# Patient Record
Sex: Female | Born: 1990 | Race: White | Hispanic: No | Marital: Married | Smoking: Former smoker
Health system: Southern US, Community
[De-identification: ages and names within clinical notes are randomized; demographics above are authoritative.]

## PROBLEM LIST (undated history)

## (undated) ENCOUNTER — Ambulatory Visit (HOSPITAL_COMMUNITY): Admission: EM | Source: Home / Self Care

## (undated) ENCOUNTER — Inpatient Hospital Stay (HOSPITAL_COMMUNITY): Payer: Self-pay

## (undated) DIAGNOSIS — I959 Hypotension, unspecified: Secondary | ICD-10-CM

## (undated) DIAGNOSIS — E119 Type 2 diabetes mellitus without complications: Secondary | ICD-10-CM

---

## 2018-12-10 ENCOUNTER — Other Ambulatory Visit: Payer: Self-pay

## 2018-12-10 ENCOUNTER — Emergency Department (HOSPITAL_COMMUNITY)
Admission: EM | Admit: 2018-12-10 | Discharge: 2018-12-10 | Disposition: A | Discharge status: Home / Self Care | Payer: Medicaid Other | Attending: Emergency Medicine | Admitting: Emergency Medicine

## 2018-12-10 ENCOUNTER — Emergency Department (HOSPITAL_COMMUNITY): Payer: Medicaid Other

## 2018-12-10 ENCOUNTER — Encounter (HOSPITAL_COMMUNITY): Payer: Self-pay | Admitting: Emergency Medicine

## 2018-12-10 DIAGNOSIS — F17200 Nicotine dependence, unspecified, uncomplicated: Secondary | ICD-10-CM | POA: Diagnosis not present

## 2018-12-10 DIAGNOSIS — Y939 Activity, unspecified: Secondary | ICD-10-CM | POA: Insufficient documentation

## 2018-12-10 DIAGNOSIS — W010XXA Fall on same level from slipping, tripping and stumbling without subsequent striking against object, initial encounter: Secondary | ICD-10-CM | POA: Insufficient documentation

## 2018-12-10 DIAGNOSIS — Y929 Unspecified place or not applicable: Secondary | ICD-10-CM | POA: Insufficient documentation

## 2018-12-10 DIAGNOSIS — Y999 Unspecified external cause status: Secondary | ICD-10-CM | POA: Diagnosis not present

## 2018-12-10 DIAGNOSIS — S60416A Abrasion of right little finger, initial encounter: Secondary | ICD-10-CM | POA: Diagnosis not present

## 2018-12-10 DIAGNOSIS — S6991XA Unspecified injury of right wrist, hand and finger(s), initial encounter: Secondary | ICD-10-CM

## 2018-12-10 DIAGNOSIS — S6992XA Unspecified injury of left wrist, hand and finger(s), initial encounter: Secondary | ICD-10-CM | POA: Diagnosis present

## 2018-12-10 NOTE — ED Triage Notes (Signed)
Patient fell and injured her right 5th finger when her dog pushed her this afternoon , no LOC/ambulatory , presents with mild redness at 5th right knuckle and superficial abrasion .

## 2018-12-10 NOTE — Discharge Instructions (Signed)
Can take tylenol or motrin for pain.  Up to 800mg  motrin 3x daily and up to 2000mg  tylenol daily is fine. Can follow-up with hand specialist if you have any ongoing issues-- call office for appt. Return here for any acute changes.

## 2018-12-10 NOTE — ED Provider Notes (Signed)
[facility] EMERGENCY DEPARTMENT Provider Note   CSN: [id number] Arrival date & time: 12/10/18  1800     History   Chief Complaint Chief Complaint  Patient presents with  . Hand Injury    HPI Robin Petersen is a 28 y.o. female.     The history is provided by the patient and a friend.     28 year old female presenting to the ED with right fifth finger pain.  States she was pushed down by dog this afternoon and fell onto right hand held into a fist.  States she has a lot of pain along her right little finger.  She is able to flex and extend but not fully.  She denies any numbness or tingling.  She is right-hand dominant.  History reviewed. No pertinent past medical history.  There are no active problems to display for this patient.   History reviewed. No pertinent surgical history.   OB History   No obstetric history on file.      Home Medications    Prior to Admission medications   Not on File    Family History No family history on file.  Social History Social History   Tobacco Use  . Smoking status: Current Every Day Smoker  . Smokeless tobacco: Never Used  Substance Use Topics  . Alcohol use: Never    Frequency: Never  . Drug use: Yes    Types: Marijuana     Allergies   Patient has no known allergies.   Review of Systems Review of Systems  Musculoskeletal: Positive for arthralgias.  All other systems reviewed and are negative.    Physical Exam Updated Vital Signs BP 114/63   Pulse 85   Temp 98.5 F (36.9 C) (Oral)   Resp 16   LMP 12/03/2018 (Approximate)   SpO2 100%   Physical Exam Vitals signs and nursing note reviewed.  Constitutional:      Appearance: She is well-developed.  HENT:     Head: Normocephalic and atraumatic.  Eyes:     Conjunctiva/sclera: Conjunctivae normal.     Pupils: Pupils are equal, round, and reactive to light.  Neck:     Musculoskeletal: Normal range of motion.  Cardiovascular:      Rate and Rhythm: Normal rate and regular rhythm.     Heart sounds: Normal heart sounds.  Pulmonary:     Effort: Pulmonary effort is normal.     Breath sounds: Normal breath sounds.  Abdominal:     General: Bowel sounds are normal.     Palpations: Abdomen is soft.  Musculoskeletal: Normal range of motion.     Comments: Right hand grossly normal in appearance, abrasion noted over fifth MCP joint, able to flex and extend finger without issue, slightly limited and unable to fully make a first, normal distal sensation and cap refill  Skin:    General: Skin is warm and dry.  Neurological:     Mental Status: She is alert and oriented to person, place, and time.      ED Treatments / Results  Labs (all labs ordered are listed, but only abnormal results are displayed) Labs Reviewed - No data to display  EKG None  Radiology Dg Hand Complete Right  Result Date: 12/10/2018 CLINICAL DATA:  28 year old who fell and injured the RIGHT small finger when her dog tripped her earlier today. Initial encounter. EXAM: RIGHT HAND - COMPLETE 3+ VIEW COMPARISON:  None. FINDINGS: Slight DORSAL subluxation of the fifth MTP joint  as seen on the LATERAL image. No evidence of acute fracture. Well-preserved joint spaces. Well-preserved bone mineral density. IMPRESSION: Slight DORSAL subluxation of the fifth MTP joint.  No fractures. Electronically Signed   By: Hulan Saas M.D.   On: 12/10/2018 19:52    Procedures Procedures (including critical care time)  Medications Ordered in ED Medications - No data to display   Initial Impression / Assessment and Plan / ED Course  I have reviewed the triage vital signs and the nursing notes.  Pertinent labs & imaging results that were available during my care of the patient were reviewed by me and considered in my medical decision making (see chart for details).  28 year old female here with right fifth finger injury.  She was pushed down by her dog today and  fell onto hand balled into a fist.  She reports pain throughout 5th digit.  She has no gross deformity on exam, does have small abrasion over the fifth MCP joint.  She is able to flex and extend finger, slightly limited from baseline.  X-ray with slight dorsal subluxation of the fifth MCP joint.  I question if this is simply positioning as this is not evident on exam.  She was placed into a static splint.  She will be given hand surgery follow-up if any ongoing issues.  She may return here for any new or acute changes.  Final Clinical Impressions(s) / ED Diagnoses   Final diagnoses:  Finger injury, right, initial encounter    ED Discharge Orders    None       Garlon Hatchet, PA-C 12/10/18 2259    Gerhard Munch, MD 12/11/18 [phone]

## 2018-12-10 NOTE — ED Notes (Signed)
Patient verbalizes understanding of discharge instructions. Opportunity for questioning and answers were provided. Armband removed by staff, pt discharged from ED ambulatory to home.  

## 2018-12-15 ENCOUNTER — Encounter (HOSPITAL_COMMUNITY): Payer: Self-pay | Admitting: Emergency Medicine

## 2018-12-15 ENCOUNTER — Emergency Department (HOSPITAL_COMMUNITY): Payer: Self-pay

## 2018-12-15 ENCOUNTER — Other Ambulatory Visit: Payer: Self-pay

## 2018-12-15 ENCOUNTER — Emergency Department (HOSPITAL_COMMUNITY)
Admission: EM | Admit: 2018-12-15 | Discharge: 2018-12-15 | Disposition: A | Discharge status: Home / Self Care | Payer: Self-pay | Attending: Emergency Medicine | Admitting: Emergency Medicine

## 2018-12-15 DIAGNOSIS — F17210 Nicotine dependence, cigarettes, uncomplicated: Secondary | ICD-10-CM | POA: Insufficient documentation

## 2018-12-15 DIAGNOSIS — S9001XA Contusion of right ankle, initial encounter: Secondary | ICD-10-CM | POA: Insufficient documentation

## 2018-12-15 DIAGNOSIS — Y9301 Activity, walking, marching and hiking: Secondary | ICD-10-CM | POA: Insufficient documentation

## 2018-12-15 DIAGNOSIS — X500XXA Overexertion from strenuous movement or load, initial encounter: Secondary | ICD-10-CM | POA: Insufficient documentation

## 2018-12-15 DIAGNOSIS — Y999 Unspecified external cause status: Secondary | ICD-10-CM | POA: Insufficient documentation

## 2018-12-15 DIAGNOSIS — Y929 Unspecified place or not applicable: Secondary | ICD-10-CM | POA: Insufficient documentation

## 2018-12-15 DIAGNOSIS — S99911A Unspecified injury of right ankle, initial encounter: Secondary | ICD-10-CM

## 2018-12-15 MED ORDER — NAPROXEN 500 MG PO TABS: 500.0000 mg | ORAL_TABLET | Freq: Two times a day (BID) | ORAL | 0 refills | Status: DC

## 2018-12-15 MED ORDER — NAPROXEN 250 MG PO TABS
500.0000 mg | ORAL_TABLET | Freq: Once | ORAL | Status: AC
  Administered 2018-12-15: 14:00:00 500 mg via ORAL
  Filled 2018-12-15: qty 2

## 2018-12-15 NOTE — Progress Notes (Signed)
Orthopedic Tech Progress Note Patient Details:  Robin Petersen [birth year] [id number]  Ortho Devices Type of Ortho Device: ASO, Crutches Ortho Device/Splint Location: right Ortho Device/Splint Interventions: Application   Post Interventions Patient Tolerated: Well Instructions Provided: Care of device   [name] 12/15/2018, 1:47 PM

## 2018-12-15 NOTE — Discharge Instructions (Signed)
Please read and follow all provided instructions.  You have been seen today for a right ankle injury.   Tests performed today include: An x-ray of the affected area - does NOT show any broken bones or dislocations.  Vital signs. See below for your results today.   Home care instructions: -- *PRICE in the first 24-48 hours after injury: Protect (with brace, splint, sling), if given by your provider Rest Ice- Do not apply ice pack directly to your skin, place towel or similar between your skin and ice/ice pack. Apply ice for 20 min, then remove for 40 min while awake Compression- Wear brace, elastic bandage, splint as directed by your provider Elevate affected extremity above the level of your heart when not walking around for the first 24-48 hours   Medications:  - Naproxen is a nonsteroidal anti-inflammatory medication that will help with pain and swelling. Be sure to take this medication as prescribed with food, 1 pill every 12 hours,  It should be taken with food, as it can cause stomach upset, and more seriously, stomach bleeding. Do not take other nonsteroidal anti-inflammatory medications with this such as Advil, Motrin, Aleve, Mobic, Goodie Powder, or Motrin.   You make take Tylenol per over the counter dosing with these medications.   We have prescribed you new medication(s) today. Discuss the medications prescribed today with your pharmacist as they can have adverse effects and interactions with your other medicines including over the counter and prescribed medications. Seek medical evaluation if you start to experience new or abnormal symptoms after taking one of these medicines, seek care immediately if you start to experience difficulty breathing, feeling of your throat closing, facial swelling, or rash as these could be indications of a more serious allergic reaction   Follow-up instructions: Please follow-up with your primary care provider or the provided orthopedic physician  (bone specialist) if you continue to have significant pain in 1 week. In this case you may have a more severe injury that requires further care.   Return instructions:  Please return if your digits or extremity are numb or tingling, appear gray or blue, or you have severe pain (also elevate the extremity and loosen splint or wrap if you were given one) Please return if you have redness or fevers.  Please return to the Emergency Department if you experience worsening symptoms.  Please return if you have any other emergent concerns. Additional Information:  Your vital signs today were: BP 98/74    Pulse 89    Temp 98.5 F (36.9 C) (Oral)    Resp 16    Ht 5\' 7"  (1.702 m)    Wt 113.4 kg    LMP 12/03/2018 (Approximate)    SpO2 100%    BMI 39.16 kg/m  If your blood pressure (BP) was elevated above 135/85 this visit, please have this repeated by your doctor within one month. ---------------

## 2018-12-15 NOTE — ED Provider Notes (Signed)
[facility]   CSN: [id number] Arrival date & time: 12/15/18  1035     History   Chief Complaint Chief Complaint  Patient presents with  . Ankle Pain    HPI Robin Petersen is a 28 y.o. female with a hx of tobacco abuse who presents to the ED w/ complaints of R ankle pain s/p injury yesterday. Patient states she was walking, tripped, inverted the ankle & fell to the ground. Denies head injury or LOC. States she has pain only to the R ankle area which is constant, 10/10 in severity, worse with weightbearing/movement, no alleviating factors, no intervention PTA. Reports associated swelling. Denies fever, chills, numbness, weakness, or open wounds. LMP 2 weeks ago, not currently sexually active, denies chance of pregnancy.     HPI  History reviewed. No pertinent past medical history.  There are no active problems to display for this patient.   History reviewed. No pertinent surgical history.   OB History   No obstetric history on file.      Home Medications    Prior to Admission medications   Not on File    Family History No family history on file.  Social History Social History   Tobacco Use  . Smoking status: Current Every Day Smoker  . Smokeless tobacco: Never Used  Substance Use Topics  . Alcohol use: Never    Frequency: Never  . Drug use: Yes    Types: Marijuana     Allergies   Patient has no known allergies.   Review of Systems Review of Systems  Constitutional: Negative for chills and fever.  Respiratory: Negative for shortness of breath.   Cardiovascular: Negative for chest pain.  Gastrointestinal: Negative for abdominal pain.  Musculoskeletal: Positive for arthralgias and joint swelling. Negative for back pain and neck pain.  Skin: Negative for wound.  Neurological: Negative for weakness and numbness.     Physical Exam Updated Vital Signs BP (!) 89/58 (BP Location: Right Arm)    Pulse 94   Temp 98.5 F (36.9 C) (Oral)   Resp 16   Ht 5\' 7"  (1.702 m)   Wt 113.4 kg   LMP 12/03/2018 (Approximate)   SpO2 100%   BMI 39.16 kg/m   Physical Exam Vitals signs and nursing note reviewed.  Constitutional:      General: She is not in acute distress.    Appearance: She is not ill-appearing or toxic-appearing.  HENT:     Head: Normocephalic and atraumatic.  Neck:     Musculoskeletal: Normal range of motion and neck supple.     Comments: No midline tenderness.  Cardiovascular:     Rate and Rhythm: Normal rate and regular rhythm.     Pulses:          Dorsalis pedis pulses are 2+ on the right side and 2+ on the left side.       Posterior tibial pulses are 2+ on the right side and 2+ on the left side.  Pulmonary:     Effort: Pulmonary effort is normal.     Breath sounds: Normal breath sounds.  Chest:     Chest wall: No tenderness.  Abdominal:     Tenderness: There is no abdominal tenderness.  Musculoskeletal:     Comments: Upper extremeties: Intact AROM. No focal tenderness Back: No midline spinal tenderness.  Lower extremities: No obvious deformity, erythema, warmth, or open wounds. Swelling w/ mild bruising to the lateral right ankle.  Patient has intact AROM to bilateral hips, knees, digits and to the L ankle. R ankle plantar/dorsiflexio mildly limited- able to move in each direction. Tender to palpation to the lateral malleolus, lateral ankle ligaments, & navicular area of the RLE, otherwise nontender. No tenderness to the fibular head or base of the 5th metatarsal.   Skin:    General: Skin is warm and dry.     Capillary Refill: Capillary refill takes less than 2 seconds.  Neurological:     Mental Status: She is alert.     Comments: Alert. Clear speech. Sensation grossly intact to bilateral lower extremities. 5/5 strength with plantar/dorsiflexion bilaterally.   Psychiatric:        Mood and Affect: Mood normal.        Behavior: Behavior normal.      ED  Treatments / Results  Labs (all labs ordered are listed, but only abnormal results are displayed) Labs Reviewed - No data to display  EKG None  Radiology Dg Ankle Complete Right  Result Date: 12/15/2018 CLINICAL DATA:  Trip and fall, lateral ankle pain EXAM: RIGHT ANKLE - COMPLETE 3+ VIEW COMPARISON:  None. FINDINGS: No fracture or dislocation of the right ankle. Joint spaces are preserved. Large plantar and Achilles calcaneal spurs. Soft tissues are unremarkable. IMPRESSION: No fracture or dislocation of the right ankle. Electronically Signed   By: Lauralyn Primes M.D.   On: 12/15/2018 12:41   Dg Foot Complete Right  Result Date: 12/15/2018 CLINICAL DATA:  28 year old female with a history of fall and pain EXAM: RIGHT FOOT COMPLETE - 3+ VIEW COMPARISON:  None. FINDINGS: There is no evidence of fracture or dislocation. Degenerative changes of the hindfoot including enthesopathic changes at the plantar fascia insertion and Achilles insertion soft tissues are unremarkable. IMPRESSION: Negative for acute bony abnormality Electronically Signed   By: Gilmer Mor D.O.   On: 12/15/2018 12:28    Procedures Procedures (including critical care time)   Medications Ordered in ED Medications  naproxen (NAPROSYN) tablet 500 mg (has no administration in time range)     Initial Impression / Assessment and Plan / ED Course  I have reviewed the triage vital signs and the nursing notes.  Pertinent labs & imaging results that were available during my care of the patient were reviewed by me and considered in my medical decision making (see chart for details).   Patient presents to the emergency department with complaints of right ankle pain status post injury yesterday.  Nontoxic-appearing, resting comfortably, blood pressures are soft, but appear to run soft with previous visits, otherwise vitals WNL.  No signs of serious head, neck, back, chest or abdominal injury.  Appears to have localized injury to  the right ankle/foot with some mild bruising and swelling, mild limitation in range of motion, tenderness most prominently to the lateral ankle.  X-rays are negative for fracture/dislocation.  Neurovascularly intact distally.  Will place in an ankle ASO, provide crutches, and provide prescription for naproxen.  Recommended PRICE.  Orthopedics information provided for follow-up. I discussed results, treatment plan, need for follow-up, and return precautions with the patient. Provided opportunity for questions, patient confirmed understanding and is in agreement with plan.   Blood pressure 98/74, pulse 89, temperature 98.5 F (36.9 C), temperature source Oral, resp. rate 16, height 5\' 7"  (1.702 m), weight 113.4 kg, last menstrual period 12/03/2018, SpO2 100 %. Final Clinical Impressions(s) / ED Diagnoses   Final diagnoses:  Injury of right ankle, initial encounter  ED Discharge Orders         Ordered    naproxen (NAPROSYN) 500 MG tablet  2 times daily     12/15/18 [address], [place], PA-C 12/15/18 1413    Wyvonnia Dusky, MD 12/15/18 2059

## 2018-12-15 NOTE — ED Notes (Signed)
Patient Alert and oriented to baseline. Stable and ambulatory to baseline. Patient verbalized understanding of the discharge instructions.  Patient belongings were taken by the patient.   

## 2018-12-15 NOTE — ED Triage Notes (Signed)
Pt. Stated, I tripped yesterday and I felt my ankle pop yesterday.

## 2018-12-20 ENCOUNTER — Encounter (HOSPITAL_COMMUNITY): Payer: Self-pay | Admitting: *Deleted

## 2018-12-20 ENCOUNTER — Emergency Department (HOSPITAL_COMMUNITY)
Admission: EM | Admit: 2018-12-20 | Discharge: 2018-12-20 | Disposition: A | Discharge status: Home / Self Care | Payer: Self-pay | Attending: Emergency Medicine | Admitting: Emergency Medicine

## 2018-12-20 ENCOUNTER — Other Ambulatory Visit: Payer: Self-pay

## 2018-12-20 DIAGNOSIS — E119 Type 2 diabetes mellitus without complications: Secondary | ICD-10-CM | POA: Insufficient documentation

## 2018-12-20 DIAGNOSIS — N390 Urinary tract infection, site not specified: Secondary | ICD-10-CM | POA: Insufficient documentation

## 2018-12-20 DIAGNOSIS — F17210 Nicotine dependence, cigarettes, uncomplicated: Secondary | ICD-10-CM | POA: Insufficient documentation

## 2018-12-20 HISTORY — DX: Hypotension, unspecified: I95.9

## 2018-12-20 HISTORY — DX: Type 2 diabetes mellitus without complications: E11.9

## 2018-12-20 LAB — CBC
HCT: 41.6 % (ref 36.0–46.0)
Hemoglobin: 14 g/dL (ref 12.0–15.0)
MCH: 31.4 pg (ref 26.0–34.0)
MCHC: 33.7 g/dL (ref 30.0–36.0)
MCV: 93.3 fL (ref 80.0–100.0)
Platelets: 199 10*3/uL (ref 150–400)
RBC: 4.46 MIL/uL (ref 3.87–5.11)
RDW: 12.6 % (ref 11.5–15.5)
WBC: 16 10*3/uL — ABNORMAL HIGH (ref 4.0–10.5)
nRBC: 0 % (ref 0.0–0.2)

## 2018-12-20 LAB — URINALYSIS, ROUTINE W REFLEX MICROSCOPIC
Bilirubin Urine: NEGATIVE
Glucose, UA: NEGATIVE mg/dL
Hgb urine dipstick: NEGATIVE
Ketones, ur: NEGATIVE mg/dL
Nitrite: NEGATIVE
Protein, ur: 100 mg/dL — AB
RBC / HPF: >50 RBC/hpf — ABNORMAL HIGH (ref 0–5)
Specific Gravity, Urine: 1.027 (ref 1.005–1.030)
WBC, UA: >50 WBC/hpf — ABNORMAL HIGH (ref 0–5)
pH: 5 (ref 5.0–8.0)

## 2018-12-20 LAB — COMPREHENSIVE METABOLIC PANEL
ALT: 12 U/L (ref 0–44)
AST: 14 U/L — ABNORMAL LOW (ref 15–41)
Albumin: 3.1 g/dL — ABNORMAL LOW (ref 3.5–5.0)
Alkaline Phosphatase: 77 U/L (ref 38–126)
Anion gap: 10 (ref 5–15)
BUN: 6 mg/dL (ref 6–20)
CO2: 23 mmol/L (ref 22–32)
Calcium: 8.3 mg/dL — ABNORMAL LOW (ref 8.9–10.3)
Chloride: 104 mmol/L (ref 98–111)
Creatinine, Ser: 0.81 mg/dL (ref 0.44–1.00)
GFR calc Af Amer: >60 mL/min (ref >60)
GFR calc non Af Amer: >60 mL/min (ref >60)
Glucose, Bld: 98 mg/dL (ref 70–99)
Potassium: 4.2 mmol/L (ref 3.5–5.1)
Sodium: 137 mmol/L (ref 135–145)
Total Bilirubin: 1 mg/dL (ref 0.3–1.2)
Total Protein: 6.8 g/dL (ref 6.5–8.1)

## 2018-12-20 LAB — I-STAT BETA HCG BLOOD, ED (MC, WL, AP ONLY): I-stat hCG, quantitative: <5 m[IU]/mL (ref <5)

## 2018-12-20 LAB — LIPASE, BLOOD: Lipase: 21 U/L (ref 11–51)

## 2018-12-20 MED ORDER — ONDANSETRON 4 MG PO TBDP
4.0000 mg | ORAL_TABLET | Freq: Once | ORAL | Status: AC
  Administered 2018-12-20: 4 mg via ORAL
  Filled 2018-12-20: qty 1

## 2018-12-20 MED ORDER — ONDANSETRON HCL 4 MG PO TABS: 4.0000 mg | ORAL_TABLET | Freq: Four times a day (QID) | ORAL | 0 refills | Status: DC

## 2018-12-20 MED ORDER — CEPHALEXIN 500 MG PO CAPS: 500.0000 mg | ORAL_CAPSULE | Freq: Four times a day (QID) | ORAL | 0 refills | Status: AC

## 2018-12-20 MED ORDER — PHENAZOPYRIDINE HCL 200 MG PO TABS: 200.0000 mg | ORAL_TABLET | Freq: Three times a day (TID) | ORAL | 0 refills | Status: AC

## 2018-12-20 MED ORDER — NAPROXEN 250 MG PO TABS
500.0000 mg | ORAL_TABLET | Freq: Once | ORAL | Status: AC
  Administered 2018-12-20: 18:00:00 500 mg via ORAL
  Filled 2018-12-20: qty 2

## 2018-12-20 MED ORDER — SODIUM CHLORIDE 0.9% FLUSH: 3.0000 mL | Freq: Once | INTRAVENOUS | Status: DC

## 2018-12-20 MED ORDER — PHENAZOPYRIDINE HCL 100 MG PO TABS
95.0000 mg | ORAL_TABLET | Freq: Once | ORAL | Status: AC
  Administered 2018-12-20: 100 mg via ORAL
  Filled 2018-12-20: qty 1

## 2018-12-20 MED ORDER — CEPHALEXIN 250 MG PO CAPS
500.0000 mg | ORAL_CAPSULE | Freq: Once | ORAL | Status: AC
  Administered 2018-12-20: 18:00:00 500 mg via ORAL
  Filled 2018-12-20: qty 2

## 2018-12-20 MED ORDER — NAPROXEN 500 MG PO TABS: 500.0000 mg | ORAL_TABLET | Freq: Two times a day (BID) | ORAL | 0 refills | Status: AC

## 2018-12-20 NOTE — ED Provider Notes (Addendum)
[facility] EMERGENCY DEPARTMENT Provider Note   CSN: [id number] Arrival date & time: 12/20/18  1558     History   Chief Complaint Chief Complaint  Patient presents with  . Abdominal Pain    HPI Robin Petersen is a 28 y.o. female.     Patient is a 28 year old female with past medical history of diabetes and hypertension who presents emergency department for dysuria and left lower quadrant pain since yesterday.  Reports that this feels like when she has had urinary tract infections in the past.  Denies any vaginal discharge, pelvic pain, fever, chills, diarrhea.  Reports one episode of vomiting yesterday.  Reports that she is not sexually active and has no concerns for sexually transmitted infection.  She has not tried any medication for relief.  She is tolerating p.o. well today.     Past Medical History:  Diagnosis Date  . Diabetes mellitus without complication ([place])   . Hypotension     There are no active problems to display for this patient.   History reviewed. No pertinent surgical history.   OB History    Gravida  1   Para      Term      Preterm      AB      Living        SAB      TAB      Ectopic      Multiple      Live Births               Home Medications    Prior to Admission medications   Medication Sig Start Date End Date Taking? Authorizing Provider  ondansetron (ZOFRAN) 4 MG tablet Take 1 tablet (4 mg total) by mouth every 6 (six) hours. 12/20/18   Alveria Apley, PA-C    Family History No family history on file.  Social History Social History   Tobacco Use  . Smoking status: Current Every Day Smoker    Packs/day: 1.00    Types: Cigarettes  . Smokeless tobacco: Never Used  Substance Use Topics  . Alcohol use: Never    Frequency: Never  . Drug use: Yes    Types: Marijuana     Allergies   Patient has no known allergies.   Review of Systems Review of Systems  Constitutional: Negative for  appetite change, chills and fever.  HENT: Negative for congestion and sore throat.   Respiratory: Negative for cough and shortness of breath.   Cardiovascular: Negative for chest pain.  Genitourinary: Positive for dysuria and vaginal pain (Irritation with urination). Negative for decreased urine volume, difficulty urinating, dyspareunia, flank pain, genital sores, hematuria, menstrual problem, pelvic pain, urgency, vaginal bleeding and vaginal discharge.  Musculoskeletal: Negative for arthralgias, back pain and myalgias.  Skin: Negative for rash and wound.  Neurological: Negative for light-headedness.     Physical Exam Updated Vital Signs BP 102/76 (BP Location: Right Arm)   Pulse 90   Temp 98.9 F (37.2 C) (Oral)   Resp 20   Ht 5\' 7"  (1.702 m)   Wt 113.4 kg   LMP 12/17/2018   SpO2 99%   BMI 39.15 kg/m   Physical Exam Vitals signs and nursing note reviewed.  Constitutional:      Appearance: Normal appearance. She is well-developed.  HENT:     Head: Normocephalic.  Eyes:     Conjunctiva/sclera: Conjunctivae normal.  Pulmonary:     Effort: Pulmonary effort is  normal.  Abdominal:     General: Bowel sounds are normal.     Tenderness: There is no abdominal tenderness. There is no right CVA tenderness or left CVA tenderness.  Skin:    General: Skin is dry.  Neurological:     Mental Status: She is alert.  Psychiatric:        Mood and Affect: Mood normal.      ED Treatments / Results  Labs (all labs ordered are listed, but only abnormal results are displayed) Labs Reviewed  URINE CULTURE - Abnormal; Notable for the following components:      Result Value   Culture MULTIPLE SPECIES PRESENT, SUGGEST RECOLLECTION (*)    All other components within normal limits  COMPREHENSIVE METABOLIC PANEL - Abnormal; Notable for the following components:   Calcium 8.3 (*)    Albumin 3.1 (*)    AST 14 (*)    All other components within normal limits  CBC - Abnormal; Notable for the  following components:   WBC 16.0 (*)    All other components within normal limits  URINALYSIS, ROUTINE W REFLEX MICROSCOPIC - Abnormal; Notable for the following components:   Color, Urine AMBER (*)    APPearance CLOUDY (*)    Protein, ur 100 (*)    Leukocytes,Ua LARGE (*)    RBC / HPF >50 (*)    WBC, UA >50 (*)    Bacteria, UA RARE (*)    All other components within normal limits  LIPASE, BLOOD  I-STAT BETA HCG BLOOD, ED ([facility], [facility], [facility] ONLY)    EKG None  Radiology No results found.  Procedures Procedures (including critical care time)  Medications Ordered in ED Medications  phenazopyridine (PYRIDIUM) tablet 100 mg (100 mg Oral Given 12/20/18 1815)  cephALEXin (KEFLEX) capsule 500 mg (500 mg Oral Given 12/20/18 1816)  ondansetron (ZOFRAN-ODT) disintegrating tablet 4 mg (4 mg Oral Given 12/20/18 1815)  naproxen (NAPROSYN) tablet 500 mg (500 mg Oral Given 12/20/18 1815)     Initial Impression / Assessment and Plan / ED Course  I have reviewed the triage vital signs and the nursing notes.  Pertinent labs & imaging results that were available during my care of the patient were reviewed by me and considered in my medical decision making (see chart for details).  Clinical Course as of Jan 30 1199  Fri Dec 20, 2018  [id number] 27 year old patient with dysuria for 2 days.  Not sexually active.  Consistent with UTI in the past.  Urine showing bacteria with red blood cells white blood cells and leukocytes.  Patient does not have any flank pain or CVA tenderness on exam.  No concern for kidney stone, STD.  Will treat for UTI and send urine for culture.  Patient given Keflex, Pyridium, naproxen.  Advised on return precautions.   [KM]    Clinical Course User Index [KM] Arlyn Dunning, PA-C       Based on review of vitals, medical screening exam, lab work and/or imaging, there does not appear to be an acute, emergent etiology for the patient's symptoms. Counseled pt on good return precautions  and encouraged both PCP and ED follow-up as needed.  Prior to discharge, I also discussed incidental imaging findings with patient in detail and advised appropriate, recommended follow-up in detail.  Clinical Impression: 1. Lower urinary tract infectious disease     Disposition: Discharge  Prior to providing a prescription for a controlled substance, I independently reviewed the patient's recent prescription history on  the [place] Controlled Substance Reporting System. The patient had no recent or regular prescriptions and [facility] a brief, less than 3 day prescription of narcotic for acute analgesia.  This note was prepared with assistance of Conservation officer, historic buildingsDragon voice recognition software. Occasional wrong-word or sound-a-like substitutions may have occurred due to the inherent limitations of voice recognition software.   Final Clinical Impressions(s) / ED Diagnoses   Final diagnoses:  Lower urinary tract infectious disease    ED Discharge Orders         Ordered    naproxen (NAPROSYN) 500 MG tablet  2 times daily     12/20/18 1737    ondansetron (ZOFRAN) 4 MG tablet  Every 6 hours     12/20/18 1737    cephALEXin (KEFLEX) 500 MG capsule  4 times daily     12/20/18 1737    phenazopyridine (PYRIDIUM) 200 MG tablet  3 times daily     12/20/18 1737           Jeral PinchMcLean, Ollin Hochmuth A, PA-C 12/20/18 1738    Pricilla LovelessGoldston, Scott, MD 12/20/18 1912    Arlyn DunningMcLean, Josepha Barbier A, PA-C 01/30/19 1200    Pricilla LovelessGoldston, Scott, MD 01/31/19 [phone]

## 2018-12-20 NOTE — ED Notes (Signed)
Patient verbalizes understanding of discharge instructions. Opportunity for questioning and answers were provided. Armband removed by staff, pt discharged from ED.  

## 2018-12-20 NOTE — ED Triage Notes (Signed)
Pt here via [facility] from home C/o  LLQ abdominal/L vulvar pain starting yesterday.   Denies discharge, but c/o pain with urination.  VS stable.

## 2018-12-20 NOTE — Discharge Instructions (Signed)
Your seen today for UTI.  We are giving antibiotics and some medicine for pain and nausea.  If you have any new or worsening symptoms please return to the emergency department.  Please take your medications as prescribed.  We are culturing your urine.  If your urine grows a bacteria that your antibiotic does not work for we will be giving you a call to change your antibiotic.  If we do not call you then please complete the antibiotic that we gave to you today.

## 2018-12-22 LAB — URINE CULTURE

## 2019-02-11 ENCOUNTER — Other Ambulatory Visit: Payer: Self-pay

## 2019-02-11 DIAGNOSIS — Z20822 Contact with and (suspected) exposure to covid-19: Secondary | ICD-10-CM

## 2019-02-13 LAB — NOVEL CORONAVIRUS, NAA: SARS-CoV-2, NAA: NOT DETECTED

## 2019-02-28 ENCOUNTER — Ambulatory Visit: Payer: Medicaid Other | Attending: Internal Medicine

## 2019-02-28 DIAGNOSIS — Z20822 Contact with and (suspected) exposure to covid-19: Secondary | ICD-10-CM

## 2019-02-28 DIAGNOSIS — Z20828 Contact with and (suspected) exposure to other viral communicable diseases: Secondary | ICD-10-CM | POA: Insufficient documentation

## 2019-03-01 LAB — NOVEL CORONAVIRUS, NAA: SARS-CoV-2, NAA: NOT DETECTED

## 2019-03-20 ENCOUNTER — Emergency Department (HOSPITAL_COMMUNITY)
Admission: EM | Admit: 2019-03-20 | Discharge: 2019-03-20 | Disposition: A | Discharge status: Home / Self Care | Payer: Medicaid Other | Attending: Emergency Medicine | Admitting: Emergency Medicine

## 2019-03-20 ENCOUNTER — Encounter (HOSPITAL_COMMUNITY): Payer: Self-pay | Admitting: *Deleted

## 2019-03-20 ENCOUNTER — Emergency Department (HOSPITAL_COMMUNITY): Payer: Medicaid Other

## 2019-03-20 DIAGNOSIS — Y999 Unspecified external cause status: Secondary | ICD-10-CM | POA: Insufficient documentation

## 2019-03-20 DIAGNOSIS — W010XXA Fall on same level from slipping, tripping and stumbling without subsequent striking against object, initial encounter: Secondary | ICD-10-CM | POA: Diagnosis not present

## 2019-03-20 DIAGNOSIS — I959 Hypotension, unspecified: Secondary | ICD-10-CM | POA: Insufficient documentation

## 2019-03-20 DIAGNOSIS — F17210 Nicotine dependence, cigarettes, uncomplicated: Secondary | ICD-10-CM | POA: Insufficient documentation

## 2019-03-20 DIAGNOSIS — S99911A Unspecified injury of right ankle, initial encounter: Secondary | ICD-10-CM | POA: Diagnosis present

## 2019-03-20 DIAGNOSIS — F1210 Cannabis abuse, uncomplicated: Secondary | ICD-10-CM | POA: Diagnosis not present

## 2019-03-20 DIAGNOSIS — S93401A Sprain of unspecified ligament of right ankle, initial encounter: Secondary | ICD-10-CM

## 2019-03-20 DIAGNOSIS — Y939 Activity, unspecified: Secondary | ICD-10-CM | POA: Insufficient documentation

## 2019-03-20 DIAGNOSIS — Y929 Unspecified place or not applicable: Secondary | ICD-10-CM | POA: Insufficient documentation

## 2019-03-20 DIAGNOSIS — E119 Type 2 diabetes mellitus without complications: Secondary | ICD-10-CM | POA: Insufficient documentation

## 2019-03-20 LAB — PREGNANCY, URINE: Preg Test, Ur: NEGATIVE

## 2019-03-20 NOTE — Progress Notes (Signed)
Orthopedic Tech Progress Note Patient Details:  Robin Petersen An October 29, 1990 [id number]  Ortho Devices Type of Ortho Device: CAM walker Ortho Device/Splint Location: RLE Ortho Device/Splint Interventions: Adjustment, Application, Ordered   Post Interventions Patient Tolerated: Well Instructions Provided: Care of device, Adjustment of device   Donald Pore 03/20/2019, 12:25 PM

## 2019-03-20 NOTE — ED Provider Notes (Signed)
[facility] EMERGENCY DEPARTMENT Provider Note   CSN: [id number] Arrival date & time: 03/20/19  1033     History Chief Complaint  Patient presents with  . Ankle Pain    Robin Petersen is a 29 y.o. female.  29 year old female presents with complaint of right ankle pain.  Patient states that she stepped in the mud today and rolled her ankle, has pain to the lateral aspect of her right ankle, worse with bearing weight.  Patient reports prior sprains to this ankle, no prior fractures.  No other injuries, complaints or concerns today.        Past Medical History:  Diagnosis Date  . Diabetes mellitus without complication ([facility])   . Hypotension     There are no problems to display for this patient.   History reviewed. No pertinent surgical history.   OB History    Gravida  1   Para      Term      Preterm      AB      Living        SAB      TAB      Ectopic      Multiple      Live Births              No family history on file.  Social History   Tobacco Use  . Smoking status: Current Every Day Smoker    Packs/day: 1.00    Types: Cigarettes  . Smokeless tobacco: Never Used  Substance Use Topics  . Alcohol use: Never  . Drug use: Yes    Types: Marijuana    Home Medications Prior to Admission medications   Medication Sig Start Date End Date Taking? Authorizing Provider  ondansetron (ZOFRAN) 4 MG tablet Take 1 tablet (4 mg total) by mouth every 6 (six) hours. 12/20/18   Alveria Apley, PA-C    Allergies    Patient has no known allergies.  Review of Systems   Review of Systems  Constitutional: Negative for fever.  Musculoskeletal: Positive for arthralgias, gait problem, joint swelling and myalgias.  Skin: Negative for rash and wound.  Neurological: Negative for weakness and numbness.  Hematological: Does not bruise/bleed easily.  Psychiatric/Behavioral: Negative for confusion.  All other systems reviewed and are  negative.   Physical Exam Updated Vital Signs BP 113/85 (BP Location: Left Arm)   Pulse (!) 108   Temp 98.6 F (37 C) (Oral)   Resp 18   LMP 12/17/2018   SpO2 97%   Physical Exam Vitals and nursing note reviewed.  Constitutional:      General: She is not in acute distress.    Appearance: She is well-developed. She is not diaphoretic.  HENT:     Head: Normocephalic and atraumatic.  Pulmonary:     Effort: Pulmonary effort is normal.  Musculoskeletal:        General: Swelling, tenderness and signs of injury present. No deformity.     Right ankle: Swelling present. No deformity, ecchymosis or lacerations. Tenderness present over the lateral malleolus. No medial malleolus, base of 5th metatarsal or proximal fibula tenderness. Decreased range of motion. Normal pulse.     Right Achilles Tendon: Normal.       Legs:  Skin:    General: Skin is warm and dry.     Findings: No erythema or rash.  Neurological:     Mental Status: She is alert and oriented to person, place,  and time.  Psychiatric:        Behavior: Behavior normal.     ED Results / Procedures / Treatments   Labs (all labs ordered are listed, but only abnormal results are displayed) Labs Reviewed  PREGNANCY, URINE    EKG None  Radiology DG Ankle Complete Right  Result Date: 03/20/2019 CLINICAL DATA:  Pain following fall EXAM: RIGHT ANKLE - COMPLETE 3+ VIEW COMPARISON:  December 15, 2018 FINDINGS: Frontal, oblique, and lateral views were obtained. There is mild soft tissue swelling. No evident fracture or joint effusion. There is no joint space narrowing or erosion. There is an inferior calcaneal spur. Ankle mortise appears intact. IMPRESSION: Mild soft tissue swelling. No fracture or appreciable arthropathy. There is an inferior calcaneal spur. Ankle mortise appears intact. Electronically Signed   By: Bretta Bang III M.D.   On: 03/20/2019 11:04    Procedures Procedures (including critical care  time)  Medications Ordered in ED Medications - No data to display  ED Course  I have reviewed the triage vital signs and the nursing notes.  Pertinent labs & imaging results that were available during my care of the patient were reviewed by me and considered in my medical decision making (see chart for details).  Clinical Course as of Mar 19 1254  Thu Mar 20, 2019  2379 29 year old female with complaint of right ankle pain after injury today.  On exam patient has mild swelling tenderness to anterior and posterior to the lateral malleolus.  No pain proximal fibula or fifth metatarsal.  X-ray shows soft tissue swelling, no fracture.  Patient was placed in a cam walker and is ambulatory without difficulty.  Patient is unsure if she may be pregnant, ordered pregnancy test prior to discharge, advised to take Tylenol, if she is not pregnant she may take Motrin.  Follow-up with orthopedics in 1 week if not improving.   [LM]    Clinical Course User Index [LM] Alden Hipp   MDM Rules/Calculators/A&P                      Final Clinical Impression(s) / ED Diagnoses Final diagnoses:  Sprain of right ankle, unspecified ligament, initial encounter    Rx / DC Orders ED Discharge Orders    None       Alden Hipp 03/20/19 1256    Raeford Razor, MD 03/21/19 1019

## 2019-03-20 NOTE — ED Triage Notes (Signed)
To ED via [facility] for eval of right ankle pain after falling in mud per pt. Right pedal pulse palpable and strong. Able to move toes but states it hurts.

## 2019-03-20 NOTE — Discharge Instructions (Addendum)
At home you can elevate your ankle and apply ice for 20 minutes at a time 3 times daily. Take Tylenol as needed as directed for pain. If your pregnancy test is negative, you may take Motrin as needed as directed for pain. Follow-up with orthopedics if not improving after 1 week.

## 2019-04-09 ENCOUNTER — Emergency Department (HOSPITAL_COMMUNITY)
Admission: EM | Admit: 2019-04-09 | Discharge: 2019-04-09 | Disposition: A | Discharge status: Home / Self Care | Payer: Medicaid Other | Attending: Emergency Medicine | Admitting: Emergency Medicine

## 2019-04-09 ENCOUNTER — Encounter (HOSPITAL_COMMUNITY): Payer: Self-pay | Admitting: Emergency Medicine

## 2019-04-09 DIAGNOSIS — E119 Type 2 diabetes mellitus without complications: Secondary | ICD-10-CM | POA: Insufficient documentation

## 2019-04-09 DIAGNOSIS — G43909 Migraine, unspecified, not intractable, without status migrainosus: Secondary | ICD-10-CM | POA: Diagnosis not present

## 2019-04-09 DIAGNOSIS — F17210 Nicotine dependence, cigarettes, uncomplicated: Secondary | ICD-10-CM | POA: Insufficient documentation

## 2019-04-09 DIAGNOSIS — R519 Headache, unspecified: Secondary | ICD-10-CM | POA: Diagnosis present

## 2019-04-09 LAB — URINALYSIS, ROUTINE W REFLEX MICROSCOPIC
Bilirubin Urine: NEGATIVE
Glucose, UA: NEGATIVE mg/dL
Ketones, ur: NEGATIVE mg/dL
Nitrite: NEGATIVE
Protein, ur: 100 mg/dL — AB
Specific Gravity, Urine: 1.027 (ref 1.005–1.030)
pH: 6 (ref 5.0–8.0)

## 2019-04-09 LAB — BASIC METABOLIC PANEL
Anion gap: 8 (ref 5–15)
BUN: 9 mg/dL (ref 6–20)
CO2: 24 mmol/L (ref 22–32)
Calcium: 8.2 mg/dL — ABNORMAL LOW (ref 8.9–10.3)
Chloride: 104 mmol/L (ref 98–111)
Creatinine, Ser: 0.83 mg/dL (ref 0.44–1.00)
GFR calc Af Amer: >60 mL/min (ref >60)
GFR calc non Af Amer: >60 mL/min (ref >60)
Glucose, Bld: 114 mg/dL — ABNORMAL HIGH (ref 70–99)
Potassium: 4.1 mmol/L (ref 3.5–5.1)
Sodium: 136 mmol/L (ref 135–145)

## 2019-04-09 LAB — CBC
HCT: 44.2 % (ref 36.0–46.0)
Hemoglobin: 14 g/dL (ref 12.0–15.0)
MCH: 29.5 pg (ref 26.0–34.0)
MCHC: 31.7 g/dL (ref 30.0–36.0)
MCV: 93.1 fL (ref 80.0–100.0)
Platelets: 228 10*3/uL (ref 150–400)
RBC: 4.75 MIL/uL (ref 3.87–5.11)
RDW: 12.7 % (ref 11.5–15.5)
WBC: 9.3 10*3/uL (ref 4.0–10.5)
nRBC: 0 % (ref 0.0–0.2)

## 2019-04-09 LAB — I-STAT BETA HCG BLOOD, ED (MC, WL, AP ONLY): I-stat hCG, quantitative: <5 m[IU]/mL (ref <5)

## 2019-04-09 MED ORDER — NAPROXEN 500 MG PO TABS: 500.0000 mg | ORAL_TABLET | Freq: Two times a day (BID) | ORAL | 0 refills | Status: DC | PRN

## 2019-04-09 MED ORDER — KETOROLAC TROMETHAMINE 15 MG/ML IJ SOLN
15.0000 mg | Freq: Once | INTRAMUSCULAR | Status: AC
  Administered 2019-04-09: 15 mg via INTRAVENOUS
  Filled 2019-04-09: qty 1

## 2019-04-09 MED ORDER — ACETAMINOPHEN 500 MG PO TABS: 500.0000 mg | ORAL_TABLET | Freq: Four times a day (QID) | ORAL | 0 refills | Status: DC | PRN

## 2019-04-09 MED ORDER — ONDANSETRON HCL 4 MG/2ML IJ SOLN
4.0000 mg | Freq: Once | INTRAMUSCULAR | Status: AC
  Administered 2019-04-09: 4 mg via INTRAVENOUS
  Filled 2019-04-09: qty 2

## 2019-04-09 MED ORDER — SODIUM CHLORIDE 0.9% FLUSH: 3.0000 mL | Freq: Once | INTRAVENOUS | Status: DC

## 2019-04-09 NOTE — ED Notes (Signed)
Patient ride is outside waiting on patient

## 2019-04-09 NOTE — ED Triage Notes (Signed)
Pt reports hx of migraines and started having a headache around 5pm and then just PTA became dizzy and felt like she might pass out.

## 2019-04-09 NOTE — Discharge Instructions (Addendum)
Please read the attachment on migraines.  I have prescribed you Tylenol and naproxen, please take as directed.  Please discontinue the naproxen should you become pregnant or begin breast-feeding.  It is important to get established with a primary care provider for ongoing evaluation and management of your health and wellbeing.  Please find a provider in the area that accepts her Medicaid insurance and call to set up an appointment.  They may be able to prescribe you a medication to reduce the incidence of your migraine headaches or refer you to neurology for ongoing evaluation management.  Please return to the ED or seek immediate medical attention for any new or worsening symptoms.

## 2019-04-09 NOTE — ED Provider Notes (Signed)
[facility] EMERGENCY DEPARTMENT Provider Note   CSN: [id number] Arrival date & time: 04/09/19  1858     History Chief Complaint  Patient presents with  . Headache    Robin Petersen is a 29 y.o. female with no significant PMH who presents to the ED with left-sided throbbing headache that began at 5 PM.  Patient reports that she is not unfamiliar with migraines and gets them on a monthly basis.  She has never seen neurology regarding her migraines and does not currently have a primary care provider.  She endorses photophobia and mild nausea, but denies any dizziness or vomiting, fevers or chills, stiff neck, chest pain or difficulty breathing, abdominal discomfort, numbness, tingling, or other neurologic symptoms.  She will occasionally take Tylenol at home for her symptoms, but it failed to relieve her headache tonight which prompted her to come into the ER for evaluation.  HPI     Past Medical History:  Diagnosis Date  . Diabetes mellitus without complication (HCC)   . Hypotension     There are no problems to display for this patient.   No past surgical history on file.   OB History    Gravida  1   Para      Term      Preterm      AB      Living        SAB      TAB      Ectopic      Multiple      Live Births              No family history on file.  Social History   Tobacco Use  . Smoking status: Current Every Day Smoker    Packs/day: 1.00    Types: Cigarettes  . Smokeless tobacco: Never Used  Substance Use Topics  . Alcohol use: Never  . Drug use: Yes    Types: Marijuana    Home Medications Prior to Admission medications   Medication Sig Start Date End Date Taking? Authorizing Provider  acetaminophen (TYLENOL) 500 MG tablet Take 1 tablet (500 mg total) by mouth every 6 (six) hours as needed for headache. 04/09/19   Chilton Si, Sharion Settler, PA-C  naproxen (NAPROSYN) 500 MG tablet Take 1 tablet (500 mg total) by mouth 2  (two) times daily between meals as needed for headache. 04/09/19   Chilton Si, Sharion Settler, PA-C  ondansetron (ZOFRAN) 4 MG tablet Take 1 tablet (4 mg total) by mouth every 6 (six) hours. 12/20/18   Arlyn Dunning, PA-C    Allergies    Patient has no known allergies.  Review of Systems   Review of Systems  Constitutional: Negative for fever.  Musculoskeletal: Negative for neck stiffness.  Neurological: Positive for headaches. Negative for dizziness, syncope and speech difficulty.    Physical Exam Updated Vital Signs BP 117/89 (BP Location: Right Arm)   Pulse 95   Temp 99 F (37.2 C) (Oral)   Resp 16   LMP 12/17/2018   SpO2 97%   Physical Exam Vitals and nursing note reviewed. Exam conducted with a chaperone present.  Constitutional:      Appearance: Normal appearance.  HENT:     Head: Normocephalic and atraumatic.  Eyes:     Extraocular Movements: Extraocular movements intact.     Conjunctiva/sclera: Conjunctivae normal.     Pupils: Pupils are equal, round, and reactive to light.     Comments: Right eye:  Droopy eyelids and hazy cornea secondary to injury as a child.  Neck:     Comments: No meningismus. Cardiovascular:     Rate and Rhythm: Normal rate and regular rhythm.     Pulses: Normal pulses.     Heart sounds: Normal heart sounds.  Pulmonary:     Effort: Pulmonary effort is normal.     Breath sounds: Normal breath sounds.  Abdominal:     General: Abdomen is flat. There is no distension.     Palpations: Abdomen is soft.     Tenderness: There is no abdominal tenderness. There is no guarding.  Musculoskeletal:     Cervical back: Normal range of motion and neck supple. No rigidity.  Skin:    General: Skin is dry.  Neurological:     Mental Status: She is alert.     GCS: GCS eye subscore is 4. GCS verbal subscore is 5. GCS motor subscore is 6.     Comments: CN II through XII grossly intact.  PERRL and EOM intact.  Negative Romberg and cerebellar exams.  Sensation intact  throughout.  Psychiatric:        Mood and Affect: Mood normal.        Behavior: Behavior normal.        Thought Content: Thought content normal.     ED Results / Procedures / Treatments   Labs (all labs ordered are listed, but only abnormal results are displayed) Labs Reviewed  BASIC METABOLIC PANEL - Abnormal; Notable for the following components:      Result Value   Glucose, Bld 114 (*)    Calcium 8.2 (*)    All other components within normal limits  URINALYSIS, ROUTINE W REFLEX MICROSCOPIC - Abnormal; Notable for the following components:   APPearance CLOUDY (*)    Hgb urine dipstick SMALL (*)    Protein, ur 100 (*)    Leukocytes,Ua LARGE (*)    Bacteria, UA FEW (*)    All other components within normal limits  CBC  I-STAT BETA HCG BLOOD, ED ([facility], [facility], [facility] ONLY)    EKG None  Radiology No results found.  Procedures Procedures (including critical care time)  Medications Ordered in ED Medications  sodium chloride flush (NS) 0.9 % injection 3 mL (has no administration in time range)  ketorolac (TORADOL) 15 MG/ML injection 15 mg (15 mg Intravenous Given 04/09/19 2146)  ondansetron (ZOFRAN) injection 4 mg (4 mg Intravenous Given 04/09/19 2145)    ED Course  I have reviewed the triage vital signs and the nursing notes.  Pertinent labs & imaging results that were available during my care of the patient were reviewed by me and considered in my medical decision making (see chart for details).    MDM Rules/Calculators/A&P                      Patient is neurovascularly intact and in no acute distress.  Treated patient with 15 mg Toradol in addition to Zofran for her nausea symptoms.  Patient did not appear to be clinically dehydrated do not feel as though IV fluid resuscitation was warranted at this time.  Patient symptoms resolved entirely with IV medications.  On reexamination, patient denies any discomfort or dizziness.  She still endorses photophobia on exam.  She is  hemodynamically stable and safe for discharge at this time.  She denies any dizziness and she ambulates without ataxia.  Her lab work today was reassuring.  UA demonstrated large leukocytes,  however patient denies any dysuria, increased urinary frequency, or other symptoms of UTI.  Pt HA treated and improved while in ED.  Presentation is like pts typical HA and non concerning for [facility], ICH, Meningitis, or temporal arteritis. Pt is afebrile with no focal neuro deficits, nuchal rigidity, or change in vision.  Patient is to become established with a primary care provider who accepts Medicaid insurance.  I encouraged her to then discuss options of prophylactic medication with her PCP.  I also encouraged her to follow-up with neurology should her episodes of migraine become more frequent.  Strict return precautions discussed.  All of the evaluation and work-up results were discussed with the patient and any family at bedside. They were provided opportunity to ask any additional questions and have none at this time. They have expressed understanding of verbal discharge instructions as well as return precautions and are agreeable to the plan.    Final Clinical Impression(s) / ED Diagnoses Final diagnoses:  Migraine without status migrainosus, not intractable, unspecified migraine type    Rx / DC Orders ED Discharge Orders         Ordered    naproxen (NAPROSYN) 500 MG tablet  2 times daily between meals PRN     04/09/19 2229    acetaminophen (TYLENOL) 500 MG tablet  Every 6 hours PRN     04/09/19 2229           Lorelee New, PA-C 04/09/19 2230    Melene Plan, DO 04/09/19 2236

## 2019-04-09 NOTE — ED Notes (Signed)
Patient verbalizes understanding of discharge instructions. Opportunity for questioning and answers were provided. Armband removed by staff, pt discharged from ED.  

## 2019-04-09 NOTE — ED Notes (Signed)
Medications given and charted per MAR. Tolerated well.  

## 2019-05-07 ENCOUNTER — Other Ambulatory Visit: Payer: Self-pay

## 2019-05-07 ENCOUNTER — Encounter (HOSPITAL_COMMUNITY): Payer: Self-pay | Admitting: *Deleted

## 2019-05-07 ENCOUNTER — Emergency Department (HOSPITAL_COMMUNITY): Payer: Medicaid Other

## 2019-05-07 ENCOUNTER — Emergency Department (HOSPITAL_COMMUNITY)
Admission: EM | Admit: 2019-05-07 | Discharge: 2019-05-07 | Disposition: A | Discharge status: Home / Self Care | Payer: Medicaid Other | Attending: Emergency Medicine | Admitting: Emergency Medicine

## 2019-05-07 DIAGNOSIS — Z3202 Encounter for pregnancy test, result negative: Secondary | ICD-10-CM | POA: Insufficient documentation

## 2019-05-07 DIAGNOSIS — S161XXA Strain of muscle, fascia and tendon at neck level, initial encounter: Secondary | ICD-10-CM | POA: Diagnosis not present

## 2019-05-07 DIAGNOSIS — F17210 Nicotine dependence, cigarettes, uncomplicated: Secondary | ICD-10-CM | POA: Diagnosis not present

## 2019-05-07 DIAGNOSIS — S0990XA Unspecified injury of head, initial encounter: Secondary | ICD-10-CM | POA: Insufficient documentation

## 2019-05-07 DIAGNOSIS — S199XXA Unspecified injury of neck, initial encounter: Secondary | ICD-10-CM | POA: Diagnosis present

## 2019-05-07 DIAGNOSIS — Y9389 Activity, other specified: Secondary | ICD-10-CM | POA: Diagnosis not present

## 2019-05-07 DIAGNOSIS — Y92410 Unspecified street and highway as the place of occurrence of the external cause: Secondary | ICD-10-CM | POA: Insufficient documentation

## 2019-05-07 DIAGNOSIS — Z7984 Long term (current) use of oral hypoglycemic drugs: Secondary | ICD-10-CM | POA: Diagnosis not present

## 2019-05-07 DIAGNOSIS — R4182 Altered mental status, unspecified: Secondary | ICD-10-CM | POA: Insufficient documentation

## 2019-05-07 DIAGNOSIS — R519 Headache, unspecified: Secondary | ICD-10-CM | POA: Insufficient documentation

## 2019-05-07 DIAGNOSIS — Y999 Unspecified external cause status: Secondary | ICD-10-CM | POA: Insufficient documentation

## 2019-05-07 DIAGNOSIS — E119 Type 2 diabetes mellitus without complications: Secondary | ICD-10-CM | POA: Insufficient documentation

## 2019-05-07 LAB — PREGNANCY, URINE: Preg Test, Ur: NEGATIVE

## 2019-05-07 MED ORDER — ACETAMINOPHEN 325 MG PO TABS
650.0000 mg | ORAL_TABLET | Freq: Once | ORAL | Status: AC
  Administered 2019-05-07: 20:00:00 650 mg via ORAL
  Filled 2019-05-07: qty 2
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
## 2019-05-07 NOTE — Discharge Instructions (Signed)
If you develop new or worsening headache, neck pain, weakness or numbness in your arms or legs, trouble speaking, vision changes, or any other new/concerning symptoms then return to the ER for evaluation.  Your Pregnancy test today is negative. Follow up with an OB/GYN for further evaluation of your abnormal menstrual cycles.

## 2019-06-09 ENCOUNTER — Other Ambulatory Visit: Payer: Self-pay

## 2019-06-09 ENCOUNTER — Emergency Department (HOSPITAL_COMMUNITY)
Admission: EM | Admit: 2019-06-09 | Discharge: 2019-06-10 | Disposition: A | Discharge status: Home / Self Care | Payer: Medicaid Other | Attending: Emergency Medicine | Admitting: Emergency Medicine

## 2019-06-09 ENCOUNTER — Encounter (HOSPITAL_COMMUNITY): Payer: Self-pay | Admitting: Emergency Medicine

## 2019-06-09 DIAGNOSIS — R1110 Vomiting, unspecified: Secondary | ICD-10-CM | POA: Diagnosis not present

## 2019-06-09 DIAGNOSIS — Z5321 Procedure and treatment not carried out due to patient leaving prior to being seen by health care provider: Secondary | ICD-10-CM | POA: Insufficient documentation

## 2019-06-09 DIAGNOSIS — R197 Diarrhea, unspecified: Secondary | ICD-10-CM | POA: Diagnosis present

## 2019-06-09 LAB — CBC
HCT: 43.5 % (ref 36.0–46.0)
Hemoglobin: 13.5 g/dL (ref 12.0–15.0)
MCH: 29.7 pg (ref 26.0–34.0)
MCHC: 31 g/dL (ref 30.0–36.0)
MCV: 95.8 fL (ref 80.0–100.0)
Platelets: 215 10*3/uL (ref 150–400)
RBC: 4.54 MIL/uL (ref 3.87–5.11)
RDW: 12.8 % (ref 11.5–15.5)
WBC: 11 10*3/uL — ABNORMAL HIGH (ref 4.0–10.5)
nRBC: 0 % (ref 0.0–0.2)

## 2019-06-09 LAB — COMPREHENSIVE METABOLIC PANEL
ALT: 11 U/L (ref 0–44)
AST: 15 U/L (ref 15–41)
Albumin: 3.4 g/dL — ABNORMAL LOW (ref 3.5–5.0)
Alkaline Phosphatase: 75 U/L (ref 38–126)
Anion gap: 9 (ref 5–15)
BUN: 8 mg/dL (ref 6–20)
CO2: 22 mmol/L (ref 22–32)
Calcium: 8.1 mg/dL — ABNORMAL LOW (ref 8.9–10.3)
Chloride: 109 mmol/L (ref 98–111)
Creatinine, Ser: 0.86 mg/dL (ref 0.44–1.00)
GFR calc Af Amer: >60 mL/min (ref >60)
GFR calc non Af Amer: >60 mL/min (ref >60)
Glucose, Bld: 142 mg/dL — ABNORMAL HIGH (ref 70–99)
Potassium: 3.6 mmol/L (ref 3.5–5.1)
Sodium: 140 mmol/L (ref 135–145)
Total Bilirubin: 0.5 mg/dL (ref 0.3–1.2)
Total Protein: 6.6 g/dL (ref 6.5–8.1)

## 2019-06-09 LAB — URINALYSIS, ROUTINE W REFLEX MICROSCOPIC
Bilirubin Urine: NEGATIVE
Glucose, UA: NEGATIVE mg/dL
Hgb urine dipstick: NEGATIVE
Ketones, ur: NEGATIVE mg/dL
Nitrite: NEGATIVE
Protein, ur: 100 mg/dL — AB
RBC / HPF: >50 RBC/hpf — ABNORMAL HIGH (ref 0–5)
Specific Gravity, Urine: 1.033 — ABNORMAL HIGH (ref 1.005–1.030)
Squamous Epithelial / HPF: >50 — ABNORMAL HIGH (ref 0–5)
WBC, UA: >50 WBC/hpf — ABNORMAL HIGH (ref 0–5)
pH: 5 (ref 5.0–8.0)

## 2019-06-09 LAB — I-STAT BETA HCG BLOOD, ED (MC, WL, AP ONLY): I-stat hCG, quantitative: <5 m[IU]/mL (ref <5)

## 2019-06-09 LAB — LIPASE, BLOOD: Lipase: 25 U/L (ref 11–51)

## 2019-06-09 MED ORDER — SODIUM CHLORIDE 0.9% FLUSH: 3.0000 mL | Freq: Once | INTRAVENOUS | Status: DC

## 2019-06-09 NOTE — ED Triage Notes (Signed)
Patient reports diarrhea with emesis today , denies abdominal pain , no fever or chills .

## 2019-06-10 NOTE — ED Notes (Signed)
Pt called for VS, no answer  

## 2019-06-10 NOTE — ED Notes (Signed)
Pt called for VS x3, no answer

## 2019-07-01 ENCOUNTER — Ambulatory Visit: Payer: Medicaid Other | Attending: Internal Medicine

## 2019-07-01 DIAGNOSIS — Z23 Encounter for immunization: Secondary | ICD-10-CM

## 2019-07-01 NOTE — Progress Notes (Signed)
   Covid-19 Vaccination Clinic  Name:  [name]    MRN: [id number] DOB: [birth year]  07/01/2019  Ms. Culley was observed post Covid-19 immunization for 15 minutes without incident. She was provided with Vaccine Information Sheet and instruction to access the V-Safe system.   Ms. Daponte was instructed to call 911 with any severe reactions post vaccine: Marland Kitchen Difficulty breathing  . Swelling of face and throat  . A fast heartbeat  . A bad rash all over body  . Dizziness and weakness   Immunizations Administered    Name Date Dose VIS Date Route   Pfizer COVID-19 Vaccine 07/01/2019  1:17 PM 0.3 mL 05/07/2018 Intramuscular   Manufacturer: [employer], [employer]   Lot: [id number]   NDC: [address]-3

## 2019-07-22 ENCOUNTER — Ambulatory Visit: Payer: Medicaid Other | Attending: Internal Medicine

## 2019-07-22 DIAGNOSIS — Z23 Encounter for immunization: Secondary | ICD-10-CM

## 2019-07-22 NOTE — Progress Notes (Signed)
   Covid-19 Vaccination Clinic  Name:  Dejuana Weist    MRN: [id number] DOB: 05/09/1990  07/22/2019  Ms. Steeber was observed post Covid-19 immunization for 15 minutes without incident. She was provided with Vaccine Information Sheet and instruction to access the V-Safe system.   Ms. Hollenkamp was instructed to call 911 with any severe reactions post vaccine: Marland Kitchen Difficulty breathing  . Swelling of face and throat  . A fast heartbeat  . A bad rash all over body  . Dizziness and weakness   Immunizations Administered    Name Date Dose VIS Date Route   Pfizer COVID-19 Vaccine 07/22/2019  1:30 PM 0.3 mL 05/07/2018 Intramuscular   Manufacturer: [employer], [employer]   Lot: [id number]   NDC: [address]-2

## 2019-09-14 ENCOUNTER — Emergency Department (HOSPITAL_COMMUNITY)
Admission: EM | Admit: 2019-09-14 | Discharge: 2019-09-14 | Disposition: A | Discharge status: Home / Self Care | Payer: Medicaid Other | Attending: Emergency Medicine | Admitting: Emergency Medicine

## 2019-09-14 ENCOUNTER — Other Ambulatory Visit: Payer: Self-pay

## 2019-09-14 ENCOUNTER — Encounter (HOSPITAL_COMMUNITY): Payer: Self-pay | Admitting: Emergency Medicine

## 2019-09-14 DIAGNOSIS — I10 Essential (primary) hypertension: Secondary | ICD-10-CM | POA: Insufficient documentation

## 2019-09-14 DIAGNOSIS — E119 Type 2 diabetes mellitus without complications: Secondary | ICD-10-CM | POA: Diagnosis not present

## 2019-09-14 DIAGNOSIS — F17210 Nicotine dependence, cigarettes, uncomplicated: Secondary | ICD-10-CM | POA: Diagnosis not present

## 2019-09-14 DIAGNOSIS — Z7984 Long term (current) use of oral hypoglycemic drugs: Secondary | ICD-10-CM | POA: Insufficient documentation

## 2019-09-14 DIAGNOSIS — M542 Cervicalgia: Secondary | ICD-10-CM | POA: Diagnosis not present

## 2019-09-14 MED ORDER — NAPROXEN 250 MG PO TABS
500.0000 mg | ORAL_TABLET | Freq: Once | ORAL | Status: AC
  Administered 2019-09-14: 500 mg via ORAL
  Filled 2019-09-14: qty 2

## 2019-09-14 MED ORDER — NAPROXEN 500 MG PO TABS: 500.0000 mg | ORAL_TABLET | Freq: Two times a day (BID) | ORAL | 0 refills | Status: AC

## 2019-09-14 NOTE — ED Provider Notes (Signed)
[facility] EMERGENCY DEPARTMENT Provider Note   CSN: [id number] Arrival date & time: 09/14/19  1657     History Chief Complaint  Patient presents with  . Neck Pain    Robin Petersen is a 29 y.o. female with past medical history significant for diabetes and hypotension.  HPI Patient presents to emergency department today with chief complaint of sudden onset of left sided neck pain.  She states she took a nap and when she woke up her neck was hurting.  The pain did not wake her up from sleep.  She states her 70 pound husky was sleeping in the bed with her.  She feels like she has a pulled muscle in her neck.  She took Tylenol with minimal symptom relief.  She describes the pain as an aching sensation.  There is no radiation of pain.  Pain is worse with movement.  She rates the pain 8 out of 10 in severity.  Denies fever, syncope, head trauma, photophobia, phonophobia, UL throbbing, N/V, visual changes, stiff neck, rash, or "thunderclap" onset of pain.  Patient tells me she had a PCP appointment yesterday where she had a negative pregnancy test.      Past Medical History:  Diagnosis Date  . Diabetes mellitus without complication (HCC)   . Hypotension     There are no problems to display for this patient.   Past Surgical History:  Procedure Laterality Date  . CESAREAN SECTION       OB History    Gravida  1   Para      Term      Preterm      AB      Living        SAB      TAB      Ectopic      Multiple      Live Births              No family history on file.  Social History   Tobacco Use  . Smoking status: Current Every Day Smoker    Packs/day: 0.50    Types: Cigarettes  . Smokeless tobacco: Never Used  Vaping Use  . Vaping Use: Never used  Substance Use Topics  . Alcohol use: Never  . Drug use: Yes    Types: Marijuana    Home Medications Prior to Admission medications   Medication Sig Start Date End Date Taking?  Authorizing Provider  acetaminophen (TYLENOL) 500 MG tablet Take 1 tablet (500 mg total) by mouth every 6 (six) hours as needed for headache. 04/09/19   Chilton Si, Sharion Settler, PA-C  naproxen (NAPROSYN) 500 MG tablet Take 1 tablet (500 mg total) by mouth 2 (two) times daily with a meal for 5 days. 09/14/19 09/19/19  Teletha Petrea E, PA-C  ondansetron (ZOFRAN) 4 MG tablet Take 1 tablet (4 mg total) by mouth every 6 (six) hours. 12/20/18   Arlyn Dunning, PA-C    Allergies    Patient has no known allergies.  Review of Systems   Review of Systems  All other systems are reviewed and are negative for acute change except as noted in the HPI.   Physical Exam Updated Vital Signs BP 99/69 (BP Location: Right Arm)   Pulse 83   Temp 98.4 F (36.9 C) (Oral)   Resp 16   LMP 05/03/2019   SpO2 100%   Breastfeeding Unknown   Physical Exam Vitals and nursing note reviewed.  Constitutional:  Appearance: She is well-developed. She is not ill-appearing or toxic-appearing.  HENT:     Head: Normocephalic and atraumatic.     Comments: No sinus or temporal tenderness.    Nose: Nose normal.  Eyes:     General: No scleral icterus.       Right eye: No discharge.        Left eye: No discharge.     Conjunctiva/sclera: Conjunctivae normal.     Comments: Right eye: Droopy eyelid and hazy cornea 2/2 to injury as a child. Unchanged today  Neck:     Vascular: No JVD.      Comments: Full ROM intact without spinous process TTP. No bony stepoffs or deformities, left paraspinous muscle TTP or muscle spasms. No rigidity or meningeal signs. No bruising, erythema, or swelling.  Cardiovascular:     Rate and Rhythm: Normal rate and regular rhythm.     Pulses: Normal pulses.     Heart sounds: Normal heart sounds.  Pulmonary:     Effort: Pulmonary effort is normal.     Breath sounds: Normal breath sounds.  Abdominal:     General: There is no distension.  Musculoskeletal:        General: Normal range of motion.      Cervical back: Normal range of motion.  Skin:    General: Skin is warm and dry.  Neurological:     Mental Status: She is oriented to person, place, and time.     GCS: GCS eye subscore is 4. GCS verbal subscore is 5. GCS motor subscore is 6.     Comments: Speech is clear and goal oriented, follows commands CN III-XII intact, no facial droop Normal strength in upper and lower extremities bilaterally including dorsiflexion and plantar flexion, strong and equal grip strength Sensation normal to light and sharp touch Moves extremities without ataxia, coordination intact Normal finger to nose and rapid alternating movements Normal gait and balance   Psychiatric:        Behavior: Behavior normal.     ED Results / Procedures / Treatments   Labs (all labs ordered are listed, but only abnormal results are displayed) Labs Reviewed - No data to display  EKG None  Radiology No results found.  Procedures Procedures (including critical care time)  Medications Ordered in ED Medications  naproxen (NAPROSYN) tablet 500 mg (500 mg Oral Given 09/14/19 1848)    ED Course  I have reviewed the triage vital signs and the nursing notes.  Pertinent labs & imaging results that were available during my care of the patient were reviewed by me and considered in my medical decision making (see chart for details).    MDM Rules/Calculators/A&P                           History provided by patient with additional history obtained from chart review.    Patient seen and examined. Patient presents awake, alert, hemodynamically stable, afebrile, non toxic.  Patient was noted to be tachycardic to 107 in triage, when heart rate rechecked tachycardia had resolved.  Patient's chief complaint listed in triage was headache.  I asked her multiple times if she was having a headache and she tells me no.  She is telling me that she has neck pain.  She has a normal neuro exam. No meningeal signs.  She has  tenderness to palpation of left posterior neck, no midline cervical spinal tenderness.  No overlying skin changes,  no crepitus, deformity or step-offs.  Patient had a negative pregnancy test at PCP office yesterday. Will give naproxen and apply heat.  Chart review shows she had normal kidney function x6 months ago. On reassessment pain has improved.  She is eager to be discharged home.  Will send prescription for naproxen and recommend PCP follow-up if symptoms persist. The patient appears reasonably screened and/or stabilized for discharge and I doubt any other medical condition or other [facility] requiring further screening, evaluation, or treatment in the ED at this time prior to discharge. The patient is safe for discharge with strict return precautions discussed.  Portions of this note were generated with Scientist, clinical (histocompatibility and immunogenetics). Dictation errors may occur despite best attempts at proofreading.  Final Clinical Impression(s) / ED Diagnoses Final diagnoses:  Neck pain    Rx / DC Orders ED Discharge Orders         Ordered    naproxen (NAPROSYN) 500 MG tablet  2 times daily with meals     Discontinue  Reprint     09/14/19 1816           Sherene Sires, PA-C 09/14/19 Dante Gang, MD 09/14/19 2212

## 2019-09-14 NOTE — ED Triage Notes (Signed)
Pt here for eval of headache x 1 hour, onset while lying down. Hx of same, sts this feels the same as her previous headaches. Endorses pain in neck with movement.

## 2019-09-14 NOTE — ED Notes (Signed)
Patient verbalizes understanding of discharge instructions. Opportunity for questioning and answers were provided. Armband removed by staff, pt discharged from ED ambulatory.   

## 2019-09-14 NOTE — Discharge Instructions (Signed)
You likely have a pulled muscle in your neck.  Take the naproxen as needed for pain.  Take with food so it does not cause upset stomach.  Do not take any other ibuprofen, Aleve, Motrin at the same time as his medicines are similar.  Can continue take Tylenol as needed for pain as well.  Follow-up with your regular doctor within 2 to 5 days for symptom recheck.  Turn to the emergency department for any new or worsening symptoms.

## 2019-10-18 ENCOUNTER — Encounter (HOSPITAL_COMMUNITY): Payer: Self-pay

## 2019-10-18 ENCOUNTER — Emergency Department (HOSPITAL_COMMUNITY): Payer: Medicaid Other

## 2019-10-18 ENCOUNTER — Emergency Department (HOSPITAL_BASED_OUTPATIENT_CLINIC_OR_DEPARTMENT_OTHER): Payer: Medicaid Other

## 2019-10-18 ENCOUNTER — Emergency Department (HOSPITAL_COMMUNITY)
Admission: EM | Admit: 2019-10-18 | Discharge: 2019-10-18 | Disposition: A | Discharge status: Home / Self Care | Payer: Medicaid Other | Attending: Emergency Medicine | Admitting: Emergency Medicine

## 2019-10-18 DIAGNOSIS — M791 Myalgia, unspecified site: Secondary | ICD-10-CM | POA: Insufficient documentation

## 2019-10-18 DIAGNOSIS — M549 Dorsalgia, unspecified: Secondary | ICD-10-CM | POA: Diagnosis not present

## 2019-10-18 DIAGNOSIS — E119 Type 2 diabetes mellitus without complications: Secondary | ICD-10-CM | POA: Diagnosis not present

## 2019-10-18 DIAGNOSIS — M79661 Pain in right lower leg: Secondary | ICD-10-CM | POA: Diagnosis not present

## 2019-10-18 DIAGNOSIS — M79604 Pain in right leg: Secondary | ICD-10-CM

## 2019-10-18 DIAGNOSIS — M79605 Pain in left leg: Secondary | ICD-10-CM | POA: Insufficient documentation

## 2019-10-18 DIAGNOSIS — F17210 Nicotine dependence, cigarettes, uncomplicated: Secondary | ICD-10-CM | POA: Insufficient documentation

## 2019-10-18 DIAGNOSIS — R52 Pain, unspecified: Secondary | ICD-10-CM

## 2019-10-18 MED ORDER — CYCLOBENZAPRINE HCL 10 MG PO TABS: 10.0000 mg | ORAL_TABLET | Freq: Two times a day (BID) | ORAL | 0 refills | Status: DC | PRN

## 2019-10-18 MED ORDER — PREDNISONE 20 MG PO TABS
60.0000 mg | ORAL_TABLET | Freq: Once | ORAL | Status: AC
  Administered 2019-10-18: 60 mg via ORAL
  Filled 2019-10-18: qty 3

## 2019-10-18 MED ORDER — HYDROCODONE-ACETAMINOPHEN 5-325 MG PO TABS
1.0000 | ORAL_TABLET | Freq: Once | ORAL | Status: AC
  Administered 2019-10-18: 1 via ORAL
  Filled 2019-10-18: qty 1

## 2019-10-18 MED ORDER — ACETAMINOPHEN 500 MG PO TABS: 500.0000 mg | ORAL_TABLET | Freq: Four times a day (QID) | ORAL | 0 refills | Status: DC | PRN

## 2019-10-18 MED ORDER — CYCLOBENZAPRINE HCL 10 MG PO TABS
10.0000 mg | ORAL_TABLET | Freq: Once | ORAL | Status: AC
  Administered 2019-10-18: 10 mg via ORAL
  Filled 2019-10-18: qty 1

## 2019-10-18 MED ORDER — IBUPROFEN 600 MG PO TABS: 600.0000 mg | ORAL_TABLET | Freq: Four times a day (QID) | ORAL | 0 refills | Status: DC | PRN

## 2019-10-18 NOTE — ED Notes (Signed)
Vs [place] completed

## 2019-10-18 NOTE — ED Notes (Signed)
Patient verbalizes understanding of discharge instructions . Opportunity for questions and answers were provided . Armband removed by staff ,Pt discharged from ED. W/C  offered at D/C  and Declined W/C at D/C and was escorted to lobby by RN.  

## 2019-10-18 NOTE — Discharge Instructions (Signed)
Your x-ray today showed no signs of fracture. Your ultrasound showed no evidence of blood clot in the leg.  1. Medications: Alternate 600 mg of ibuprofen and [phone] mg of Tylenol every 3 hours as needed for pain. Do not exceed 4000 mg of Tylenol daily.  Take ibuprofen with food to avoid upset stomach issues.  You can take Flexeril as needed for muscle spasm up to twice daily but do not drive, drink alcohol, or operate heavy machinery while taking this medicine because it may make you drowsy.  I typically recommend taking this medicine only at night when you are going to sleep.  He can also cut these tablets in half if they make you feel very drowsy. 2. Treatment: rest, drink plenty of fluids, gentle stretching as discussed (see attached), alternate ice and heat (or stick with whichever feels best) 20 minutes on 20 minutes off. 3. Follow Up: Please followup with your primary doctor in 3-7 days for discussion of your diagnoses and further evaluation after today's visit; if you do not have a primary care doctor use the resource guide provided to find one;  Return to the ER for worsening back pain, difficulty walking, loss of bowel or bladder control or other concerning symptoms

## 2019-10-18 NOTE — Progress Notes (Signed)
VASCULAR LAB    Right lower extremity venous duplexcompleted.    Preliminary report:  See CV proc for preliminary results.  Messaged [place], PA-C with report  Alazne Quant, RVT 10/18/2019, 5:03 PM

## 2019-10-18 NOTE — ED Triage Notes (Addendum)
Pt reports Right leg pain from her thigh to her calf area x1 day. Pt reports she has used muscle cream and hot baths with no relief. Pt denies any injury

## 2019-10-18 NOTE — ED Provider Notes (Signed)
[facility] EMERGENCY DEPARTMENT Provider Note   CSN: [id number] Arrival date & time: 10/18/19  1232     History Chief Complaint  Patient presents with  . Leg Pain    Robin Petersen is a 29 y.o. female with history of diabetes mellitus presents for evaluation of acute onset, persistent right lower extremity pain beginning last night while in bed.  Denies any bending, lifting, twisting injuries or other known trauma.  Reports that pain is sharp, radiates from the right hip down the anterior lateral aspect of the right lower extremity into the toes.  Sometimes feels as though her toes are tingling.  Pain worsens with prolonged standing and laying flat.  Has applied a topical cream and tried warm soaks without relief of symptoms.  Denies bowel or bladder incontinence, saddle anesthesia, fevers or history of IV drug use.  No recent travel or surgeries, hemoptysis, prior history of DVT or PE, and she is not on OCPs.  Denies chest pain or shortness of breath.  The history is provided by the patient.       Past Medical History:  Diagnosis Date  . Diabetes mellitus without complication (HCC)   . Hypotension     There are no problems to display for this patient.   Past Surgical History:  Procedure Laterality Date  . CESAREAN SECTION       OB History    Gravida  1   Para      Term      Preterm      AB      Living        SAB      TAB      Ectopic      Multiple      Live Births              No family history on file.  Social History   Tobacco Use  . Smoking status: Current Every Day Smoker    Packs/day: 0.50    Types: Cigarettes  . Smokeless tobacco: Never Used  Vaping Use  . Vaping Use: Never used  Substance Use Topics  . Alcohol use: Never  . Drug use: Yes    Types: Marijuana    Home Medications Prior to Admission medications   Medication Sig Start Date End Date Taking? Authorizing Provider  acetaminophen (TYLENOL) 500 MG  tablet Take 1 tablet (500 mg total) by mouth every 6 (six) hours as needed. 10/18/19   Kimberlyann Hollar A, PA-C  cyclobenzaprine (FLEXERIL) 10 MG tablet Take 1 tablet (10 mg total) by mouth 2 (two) times daily as needed for muscle spasms. 10/18/19   Niaya Hickok A, PA-C  ibuprofen (ADVIL) 600 MG tablet Take 1 tablet (600 mg total) by mouth every 6 (six) hours as needed. 10/18/19   Jabria Loos A, PA-C  ondansetron (ZOFRAN) 4 MG tablet Take 1 tablet (4 mg total) by mouth every 6 (six) hours. 12/20/18   Arlyn Dunning, PA-C    Allergies    Patient has no known allergies.  Review of Systems   Review of Systems  Constitutional: Negative for chills and fever.  Respiratory: Negative for shortness of breath.   Cardiovascular: Negative for chest pain.  Gastrointestinal: Negative for abdominal pain, nausea and vomiting.  Musculoskeletal: Positive for back pain and myalgias.  Neurological: Negative for weakness and numbness.  All other systems reviewed and are negative.   Physical Exam Updated Vital Signs BP 115/82 (BP Location: Right  Arm)   Pulse 69   Temp 98.7 F (37.1 C) (Oral)   Resp 18   Ht 5\' 6"  (1.676 m)   Wt 126.1 kg   SpO2 98%   BMI 44.87 kg/m   Physical Exam Vitals and nursing note reviewed.  Constitutional:      General: She is not in acute distress.    Appearance: She is well-developed.  HENT:     Head: Normocephalic and atraumatic.  Eyes:     General:        Right eye: No discharge.        Left eye: No discharge.     Conjunctiva/sclera: Conjunctivae normal.  Neck:     Vascular: No JVD.     Trachea: No tracheal deviation.  Cardiovascular:     Rate and Rhythm: Normal rate.     Pulses: Normal pulses.     Comments: 2+ DP/PT pulses bilaterally, Homans sign absent bilaterally, no lower extremity edema, no palpable cords, compartments are soft  Pulmonary:     Effort: Pulmonary effort is normal.  Abdominal:     General: There is no distension.  Musculoskeletal:     Comments:  No midline spine tenderness.  Right paralumbar muscle tenderness noted.  Right SI joint tenderness noted.  Diffuse tenderness to palpation of the right lower extremity from the hip down to the toes, mostly in the lateral distribution.  No erythema, induration, or ecchymosis.  5/5 strength of BLE major muscle groups.  Skin:    General: Skin is warm and dry.     Findings: No erythema.  Neurological:     Mental Status: She is alert.     Comments: Sensation intact to light touch of bilateral lower extremities.  Patient ambulatory with antalgic gait but exhibits good balance.  Psychiatric:        Behavior: Behavior normal.     ED Results / Procedures / Treatments   Labs (all labs ordered are listed, but only abnormal results are displayed) Labs Reviewed - No data to display  EKG None  Radiology DG FEMUR, MIN 2 VIEWS RIGHT  Result Date: 10/18/2019 CLINICAL DATA:  Atraumatic right leg pain. EXAM: RIGHT FEMUR 2 VIEWS COMPARISON:  None. FINDINGS: There is no evidence of fracture or other focal bone lesions. Soft tissues are unremarkable. IMPRESSION: Negative. Electronically Signed   By: 12/18/2019 M.D.   On: 10/18/2019 15:36   VAS 12/18/2019 LOWER EXTREMITY VENOUS (DVT) (ONLY [facility] & [facility] 7a-7p)  Result Date: 10/18/2019  Lower Venous DVTStudy Indications: Pain.  Limitations: Body habitus and pain with touch. Comparison Study: No prior study Performing Technologist: 12/18/2019 RVS  Examination Guidelines: A complete evaluation includes B-mode imaging, spectral Doppler, color Doppler, and power Doppler as needed of all accessible portions of each vessel. Bilateral testing is considered an integral part of a complete examination. Limited examinations for reoccurring indications may be performed as noted. The reflux portion of the exam is performed with the patient in reverse Trendelenburg.  +---------+---------------+---------+-----------+----------+--------------+ RIGHT     CompressibilityPhasicitySpontaneityPropertiesThrombus Aging +---------+---------------+---------+-----------+----------+--------------+ CFV      Full           Yes      Yes                                 +---------+---------------+---------+-----------+----------+--------------+ SFJ      Full                                                        +---------+---------------+---------+-----------+----------+--------------+  FV Prox                 Yes      Yes                                 +---------+---------------+---------+-----------+----------+--------------+ FV Mid   Full                                                        +---------+---------------+---------+-----------+----------+--------------+ FV DistalFull                                                        +---------+---------------+---------+-----------+----------+--------------+ PFV                     Yes      Yes                                 +---------+---------------+---------+-----------+----------+--------------+ POP      Full           No       No                                  +---------+---------------+---------+-----------+----------+--------------+ PTV      Full                                                        +---------+---------------+---------+-----------+----------+--------------+ PERO                                                  Not visualized +---------+---------------+---------+-----------+----------+--------------+   +----+---------------+---------+-----------+----------+--------------+ LEFTCompressibilityPhasicitySpontaneityPropertiesThrombus Aging +----+---------------+---------+-----------+----------+--------------+ CFV Full           Yes      Yes                                 +----+---------------+---------+-----------+----------+--------------+     Summary: RIGHT: - There is no evidence of deep vein thrombosis in the lower  extremity. However, portions of this examination were limited- see technologist comments above.  LEFT: - No evidence of common femoral vein obstruction.  *See table(s) above for measurements and observations.    Preliminary     Procedures Procedures (including critical care time)  Medications Ordered in ED Medications  cyclobenzaprine (FLEXERIL) tablet 10 mg (10 mg Oral Given 10/18/19 1610)  HYDROcodone-acetaminophen (NORCO/VICODIN) 5-325 MG per tablet 1 tablet (1 tablet Oral Given 10/18/19 1610)  predniSONE (DELTASONE) tablet 60 mg (60 mg Oral Given 10/18/19 1610)    ED Course  I have reviewed the triage vital signs and the nursing notes.  Pertinent labs &  imaging results that were available during my care of the patient were reviewed by me and considered in my medical decision making (see chart for details).    MDM Rules/Calculators/A&P                          Patient presenting for evaluation of right lower extremity pain rating down to the toes.  No midline spine tenderness.  She is afebrile, vital signs are stable.  She is uncomfortable but nontoxic in appearance.  Ambulatory in the ED despite pain.  No red flag signs concerning for cauda equina or spinal abscess.  Plain films show no evidence of acute osseous abnormality and a DVT study was obtained which was negative.  History and physical examination consistent with sciatica versus other lumbar radiculopathy.  Symptoms likely musculoskeletal in etiology.  Discussed conservative therapy and management with NSAIDs, Tylenol, Flexeril as needed.  Discussed appropriate use of medications and potential side effects.  Recommend follow-up with PCP for reevaluation of symptoms.  Discussed strict ED return precautions. Patient verbalized understanding of and agreement with plan and is safe for discharge home at this time.   Final Clinical Impression(s) / ED Diagnoses Final diagnoses:  Right leg pain    Rx / DC Orders ED Discharge Orders          Ordered    cyclobenzaprine (FLEXERIL) 10 MG tablet  2 times daily PRN     Discontinue  Reprint     10/18/19 1745    ibuprofen (ADVIL) 600 MG tablet  Every 6 hours PRN     Discontinue  Reprint     10/18/19 1745    acetaminophen (TYLENOL) 500 MG tablet  Every 6 hours PRN     Discontinue  Reprint     10/18/19 1745           Jeanie Sewer, PA-C 10/19/19 1103    Terald Sleeper, MD 10/19/19 [phone]

## 2019-11-17 ENCOUNTER — Emergency Department (HOSPITAL_COMMUNITY)
Admission: EM | Admit: 2019-11-17 | Discharge: 2019-11-17 | Disposition: A | Discharge status: Home / Self Care | Payer: Medicaid Other | Attending: Emergency Medicine | Admitting: Emergency Medicine

## 2019-11-17 ENCOUNTER — Encounter (HOSPITAL_COMMUNITY): Payer: Self-pay | Admitting: Emergency Medicine

## 2019-11-17 ENCOUNTER — Emergency Department (HOSPITAL_COMMUNITY): Payer: Medicaid Other

## 2019-11-17 DIAGNOSIS — W208XXA Other cause of strike by thrown, projected or falling object, initial encounter: Secondary | ICD-10-CM | POA: Diagnosis not present

## 2019-11-17 DIAGNOSIS — Y929 Unspecified place or not applicable: Secondary | ICD-10-CM | POA: Insufficient documentation

## 2019-11-17 DIAGNOSIS — M25572 Pain in left ankle and joints of left foot: Secondary | ICD-10-CM | POA: Insufficient documentation

## 2019-11-17 DIAGNOSIS — R609 Edema, unspecified: Secondary | ICD-10-CM

## 2019-11-17 DIAGNOSIS — Y999 Unspecified external cause status: Secondary | ICD-10-CM | POA: Diagnosis not present

## 2019-11-17 DIAGNOSIS — Z5321 Procedure and treatment not carried out due to patient leaving prior to being seen by health care provider: Secondary | ICD-10-CM | POA: Diagnosis not present

## 2019-11-17 DIAGNOSIS — Y9389 Activity, other specified: Secondary | ICD-10-CM | POA: Insufficient documentation

## 2019-11-17 DIAGNOSIS — M25472 Effusion, left ankle: Secondary | ICD-10-CM | POA: Diagnosis not present

## 2019-11-17 NOTE — ED Triage Notes (Signed)
Pt states just prior to arrival she was taking a trailer off a hitch and the trailer slipped and fell onto left ankle causing pain and swelling.

## 2019-11-17 NOTE — ED Notes (Signed)
Pt Left saying she would just come back tomorrow

## 2020-04-11 ENCOUNTER — Emergency Department (HOSPITAL_COMMUNITY): Payer: Medicaid Other

## 2020-04-11 ENCOUNTER — Other Ambulatory Visit: Payer: Self-pay

## 2020-04-11 ENCOUNTER — Emergency Department (HOSPITAL_COMMUNITY)
Admission: EM | Admit: 2020-04-11 | Discharge: 2020-04-12 | Disposition: A | Discharge status: Home / Self Care | Payer: Medicaid Other | Attending: Emergency Medicine | Admitting: Emergency Medicine

## 2020-04-11 DIAGNOSIS — F17210 Nicotine dependence, cigarettes, uncomplicated: Secondary | ICD-10-CM | POA: Insufficient documentation

## 2020-04-11 DIAGNOSIS — S0990XA Unspecified injury of head, initial encounter: Secondary | ICD-10-CM | POA: Diagnosis present

## 2020-04-11 DIAGNOSIS — E119 Type 2 diabetes mellitus without complications: Secondary | ICD-10-CM | POA: Insufficient documentation

## 2020-04-11 DIAGNOSIS — W01198A Fall on same level from slipping, tripping and stumbling with subsequent striking against other object, initial encounter: Secondary | ICD-10-CM | POA: Diagnosis not present

## 2020-04-11 DIAGNOSIS — Y92002 Bathroom of unspecified non-institutional (private) residence single-family (private) house as the place of occurrence of the external cause: Secondary | ICD-10-CM | POA: Diagnosis not present

## 2020-04-11 NOTE — ED Triage Notes (Signed)
Pt presents to ED POV. Pt c/o HA. Pt had mechanical fall, no LOC, no blood thinners.

## 2020-04-12 NOTE — ED Provider Notes (Signed)
[facility] Emergency Department Provider Note MRN:  [id number]  Arrival date & time: 04/12/20     Chief Complaint   Fall   History of Present Illness   Robin Petersen is a 30 y.o. year-old female with a history of diabetes presenting to the ED with chief complaint of fall.  Patient was in the bathroom and slipped on a wet floor and fell backwards striking the back of her head.  No loss of consciousness.  Single episode of vomiting prior to arrival.  Denies any other injuries, no neck or back pain, no arm or leg injuries, no chest pain or shortness of breath.  No abdominal pain.  Headache is mild to moderate, constant.  Review of Systems  A complete 10 system review of systems was obtained and all systems are negative except as noted in the HPI and PMH.   Patient's Health History    Past Medical History:  Diagnosis Date  . Diabetes mellitus without complication (HCC)   . Hypotension     Past Surgical History:  Procedure Laterality Date  . CESAREAN SECTION      No family history on file.  Social History   Socioeconomic History  . Marital status: Single    Spouse name: Not on file  . Number of children: Not on file  . Years of education: Not on file  . Highest education level: Not on file  Occupational History  . Not on file  Tobacco Use  . Smoking status: Current Every Day Smoker    Packs/day: 0.50    Types: Cigarettes  . Smokeless tobacco: Never Used  Vaping Use  . Vaping Use: Never used  Substance and Sexual Activity  . Alcohol use: Never  . Drug use: Yes    Types: Marijuana  . Sexual activity: Not on file  Other Topics Concern  . Not on file  Social History Narrative  . Not on file   Social Determinants of Health   Financial Resource Strain: Not on file  Food Insecurity: Not on file  Transportation Needs: Not on file  Physical Activity: Not on file  Stress: Not on file  Social Connections: Not on file  Intimate Partner  Violence: Not on file     Physical Exam   Vitals:   04/11/20 2128 04/12/20 0035  BP: 105/70 108/75  Pulse: 92 83  Resp: 20 16  Temp: 98.4 F (36.9 C)   SpO2: 100% 94%    CONSTITUTIONAL: Well-appearing, NAD NEURO:  Alert and oriented x 3, normal and symmetric strength and sensation, normal coordination, normal speech EYES:  eyes equal and reactive ENT/NECK:  no LAD, no JVD CARDIO: Regular rate, well-perfused, normal S1 and S2 PULM:  CTAB no wheezing or rhonchi GI/GU:  normal bowel sounds, non-distended, non-tender MSK/SPINE:  No gross deformities, no edema SKIN:  no rash, atraumatic PSYCH:  Appropriate speech and behavior  *Additional and/or pertinent findings included in MDM below  Diagnostic and Interventional Summary    EKG Interpretation  Date/Time:    Ventricular Rate:    PR Interval:    QRS Duration:   QT Interval:    QTC Calculation:   R Axis:     Text Interpretation:        Labs Reviewed - No data to display  CT Head Wo Contrast  Final Result      Medications - No data to display   Procedures  /  Critical Care Procedures  ED Course and Medical  Decision Making  I have reviewed the triage vital signs, the nursing notes, and pertinent available records from the EMR.  Listed above are laboratory and imaging tests that I personally ordered, reviewed, and interpreted and then considered in my medical decision making (see below for details).  Reassuring exam, CT head is normal, possible mild concussion, no cervical spinal tenderness, normal range of motion, no other traumatic findings.  Appropriate for discharge.       Elmer Sow. Pilar Plate, MD [facility] Emergency Medicine [facility] [email or web address]  Final Clinical Impressions(s) / ED Diagnoses     ICD-10-CM   1. Injury of head, initial encounter  S09.90XA     ED Discharge Orders    None       Discharge Instructions Discussed with and Provided to Patient:     Discharge  Instructions     You were evaluated in the Emergency Department and after careful evaluation, we did not find any emergent condition requiring admission or further testing in the hospital.  Your exam/testing today was overall reassuring.  CT did not show any significant injuries.  You may have sustained a mild concussion.  We recommend rest for the next 2 days until your symptoms are gone followed by slow return to normal daily activities.  Please return to the Emergency Department if you experience any worsening of your condition.  Thank you for allowing [place] to be a part of your care.        Sabas Sous, MD 04/12/20 [phone]

## 2020-04-12 NOTE — Discharge Instructions (Addendum)
You were evaluated in the Emergency Department and after careful evaluation, we did not find any emergent condition requiring admission or further testing in the hospital.  Your exam/testing today was overall reassuring.  CT did not show any significant injuries.  You may have sustained a mild concussion.  We recommend rest for the next 2 days until your symptoms are gone followed by slow return to normal daily activities.  Please return to the Emergency Department if you experience any worsening of your condition.  Thank you for allowing [place] to be a part of your care.

## 2020-05-04 ENCOUNTER — Other Ambulatory Visit: Payer: Self-pay

## 2020-05-04 ENCOUNTER — Encounter (HOSPITAL_COMMUNITY): Payer: Self-pay | Admitting: Emergency Medicine

## 2020-05-04 ENCOUNTER — Emergency Department (HOSPITAL_COMMUNITY)
Admission: EM | Admit: 2020-05-04 | Discharge: 2020-05-04 | Disposition: A | Discharge status: Home / Self Care | Payer: Medicaid Other | Attending: Emergency Medicine | Admitting: Emergency Medicine

## 2020-05-04 DIAGNOSIS — R1030 Lower abdominal pain, unspecified: Secondary | ICD-10-CM | POA: Insufficient documentation

## 2020-05-04 DIAGNOSIS — Z5321 Procedure and treatment not carried out due to patient leaving prior to being seen by health care provider: Secondary | ICD-10-CM | POA: Diagnosis not present

## 2020-05-04 LAB — CBC
HCT: 45.3 % (ref 36.0–46.0)
Hemoglobin: 14.7 g/dL (ref 12.0–15.0)
MCH: 30.1 pg (ref 26.0–34.0)
MCHC: 32.5 g/dL (ref 30.0–36.0)
MCV: 92.6 fL (ref 80.0–100.0)
Platelets: 230 10*3/uL (ref 150–400)
RBC: 4.89 MIL/uL (ref 3.87–5.11)
RDW: 12.8 % (ref 11.5–15.5)
WBC: 11.6 10*3/uL — ABNORMAL HIGH (ref 4.0–10.5)
nRBC: 0 % (ref 0.0–0.2)

## 2020-05-04 LAB — URINALYSIS, ROUTINE W REFLEX MICROSCOPIC
Bilirubin Urine: NEGATIVE
Glucose, UA: NEGATIVE mg/dL
Hgb urine dipstick: NEGATIVE
Ketones, ur: 5 mg/dL — AB
Nitrite: NEGATIVE
Protein, ur: NEGATIVE mg/dL
Specific Gravity, Urine: 1.016 (ref 1.005–1.030)
pH: 6 (ref 5.0–8.0)

## 2020-05-04 LAB — COMPREHENSIVE METABOLIC PANEL
ALT: 14 U/L (ref 0–44)
AST: 16 U/L (ref 15–41)
Albumin: 3.5 g/dL (ref 3.5–5.0)
Alkaline Phosphatase: 68 U/L (ref 38–126)
Anion gap: 10 (ref 5–15)
BUN: 12 mg/dL (ref 6–20)
CO2: 22 mmol/L (ref 22–32)
Calcium: 8.7 mg/dL — ABNORMAL LOW (ref 8.9–10.3)
Chloride: 107 mmol/L (ref 98–111)
Creatinine, Ser: 0.87 mg/dL (ref 0.44–1.00)
GFR, Estimated: >60 mL/min (ref >60)
Glucose, Bld: 93 mg/dL (ref 70–99)
Potassium: 4.3 mmol/L (ref 3.5–5.1)
Sodium: 139 mmol/L (ref 135–145)
Total Bilirubin: 0.8 mg/dL (ref 0.3–1.2)
Total Protein: 7.1 g/dL (ref 6.5–8.1)

## 2020-05-04 LAB — LIPASE, BLOOD: Lipase: 31 U/L (ref 11–51)

## 2020-05-04 LAB — I-STAT BETA HCG BLOOD, ED (MC, WL, AP ONLY): I-stat hCG, quantitative: <5 m[IU]/mL (ref <5)

## 2020-05-04 NOTE — ED Notes (Signed)
Pt stated that they were leaving  

## 2020-05-04 NOTE — ED Notes (Signed)
Pt stated that she was stepping outside

## 2020-05-04 NOTE — ED Triage Notes (Signed)
Pt reports lower abd pain x 10-20 min.  Denies nausea, vomiting, diarrhea, and urinary complaints.

## 2020-06-03 ENCOUNTER — Other Ambulatory Visit: Payer: Self-pay

## 2020-06-03 ENCOUNTER — Emergency Department (HOSPITAL_COMMUNITY)
Admission: EM | Admit: 2020-06-03 | Discharge: 2020-06-04 | Disposition: A | Discharge status: Home / Self Care | Payer: Medicaid Other | Attending: Emergency Medicine | Admitting: Emergency Medicine

## 2020-06-03 DIAGNOSIS — M545 Low back pain, unspecified: Secondary | ICD-10-CM | POA: Insufficient documentation

## 2020-06-03 DIAGNOSIS — F17210 Nicotine dependence, cigarettes, uncomplicated: Secondary | ICD-10-CM | POA: Insufficient documentation

## 2020-06-03 DIAGNOSIS — N3000 Acute cystitis without hematuria: Secondary | ICD-10-CM | POA: Diagnosis not present

## 2020-06-03 DIAGNOSIS — E119 Type 2 diabetes mellitus without complications: Secondary | ICD-10-CM | POA: Insufficient documentation

## 2020-06-03 DIAGNOSIS — R109 Unspecified abdominal pain: Secondary | ICD-10-CM | POA: Diagnosis present

## 2020-06-04 LAB — COMPREHENSIVE METABOLIC PANEL
ALT: 17 U/L (ref 0–44)
AST: 18 U/L (ref 15–41)
Albumin: 3.8 g/dL (ref 3.5–5.0)
Alkaline Phosphatase: 73 U/L (ref 38–126)
Anion gap: 9 (ref 5–15)
BUN: 12 mg/dL (ref 6–20)
CO2: 24 mmol/L (ref 22–32)
Calcium: 8.8 mg/dL — ABNORMAL LOW (ref 8.9–10.3)
Chloride: 106 mmol/L (ref 98–111)
Creatinine, Ser: 0.98 mg/dL (ref 0.44–1.00)
GFR, Estimated: >60 mL/min (ref >60)
Glucose, Bld: 88 mg/dL (ref 70–99)
Potassium: 4 mmol/L (ref 3.5–5.1)
Sodium: 139 mmol/L (ref 135–145)
Total Bilirubin: 0.3 mg/dL (ref 0.3–1.2)
Total Protein: 7.4 g/dL (ref 6.5–8.1)

## 2020-06-04 LAB — URINALYSIS, ROUTINE W REFLEX MICROSCOPIC
Bilirubin Urine: NEGATIVE
Glucose, UA: NEGATIVE mg/dL
Ketones, ur: NEGATIVE mg/dL
Nitrite: NEGATIVE
Protein, ur: NEGATIVE mg/dL
Specific Gravity, Urine: 1.023 (ref 1.005–1.030)
WBC, UA: >50 WBC/hpf — ABNORMAL HIGH (ref 0–5)
pH: 6 (ref 5.0–8.0)

## 2020-06-04 LAB — CBC
HCT: 43.8 % (ref 36.0–46.0)
Hemoglobin: 14.3 g/dL (ref 12.0–15.0)
MCH: 30.2 pg (ref 26.0–34.0)
MCHC: 32.6 g/dL (ref 30.0–36.0)
MCV: 92.4 fL (ref 80.0–100.0)
Platelets: 219 10*3/uL (ref 150–400)
RBC: 4.74 MIL/uL (ref 3.87–5.11)
RDW: 12.6 % (ref 11.5–15.5)
WBC: 11.5 10*3/uL — ABNORMAL HIGH (ref 4.0–10.5)
nRBC: 0 % (ref 0.0–0.2)

## 2020-06-04 LAB — LIPASE, BLOOD: Lipase: 33 U/L (ref 11–51)

## 2020-06-04 LAB — I-STAT BETA HCG BLOOD, ED (MC, WL, AP ONLY): I-stat hCG, quantitative: <5 m[IU]/mL (ref <5)

## 2020-06-04 MED ORDER — CEPHALEXIN 500 MG PO CAPS: 500.0000 mg | ORAL_CAPSULE | Freq: Three times a day (TID) | ORAL | 0 refills | Status: DC

## 2020-06-04 MED ORDER — NAPROXEN 500 MG PO TABS: 500.0000 mg | ORAL_TABLET | Freq: Two times a day (BID) | ORAL | 0 refills | Status: DC

## 2020-06-04 MED ORDER — NAPROXEN 250 MG PO TABS
500.0000 mg | ORAL_TABLET | Freq: Once | ORAL | Status: AC
  Administered 2020-06-04: 500 mg via ORAL
  Filled 2020-06-04: qty 2

## 2020-06-04 MED ORDER — CEPHALEXIN 250 MG PO CAPS
500.0000 mg | ORAL_CAPSULE | Freq: Once | ORAL | Status: AC
  Administered 2020-06-04: 500 mg via ORAL
  Filled 2020-06-04: qty 2

## 2020-06-04 NOTE — ED Notes (Signed)
Patient verbalizes understanding of discharge instructions. Opportunity for questioning and answers were provided. Armband removed by staff, pt discharged from ED ambulatory.   

## 2020-06-04 NOTE — ED Provider Notes (Signed)
[facility] EMERGENCY DEPARTMENT Provider Note   CSN: [id number] Arrival date & time: 06/03/20  2359     History Chief Complaint  Patient presents with  . Abdominal Pain  . Back Pain    Robin Petersen is a 30 y.o. female.  Patient with history of diabetes presents the emergency department by EMS for evaluation of abdominal pain and back pain.  Patient also reports dysuria.  Symptoms started last night.  Pain is in the low middle portion of her back.  It is worse with palpation and movement.  No associated nausea, vomiting, or diarrhea.  No fevers reported.  Patient is sexually active with one partner, her husband, and is not worried about sexually transmitted infections.  She denies vaginal bleeding or discharge.  No weakness, numbness, or tingling in her lower extremities.  No treatments prior to arrival.        Past Medical History:  Diagnosis Date  . Diabetes mellitus without complication (HCC)   . Hypotension     There are no problems to display for this patient.   Past Surgical History:  Procedure Laterality Date  . CESAREAN SECTION       OB History    Gravida  1   Para      Term      Preterm      AB      Living        SAB      IAB      Ectopic      Multiple      Live Births              No family history on file.  Social History   Tobacco Use  . Smoking status: Current Every Day Smoker    Packs/day: 0.50    Types: Cigarettes  . Smokeless tobacco: Never Used  Vaping Use  . Vaping Use: Never used  Substance Use Topics  . Alcohol use: Never  . Drug use: Yes    Types: Marijuana    Home Medications Prior to Admission medications   Medication Sig Start Date End Date Taking? Authorizing Provider  cephALEXin (KEFLEX) 500 MG capsule Take 1 capsule (500 mg total) by mouth 3 (three) times daily. 06/04/20  Yes Renne Crigler, PA-C  naproxen (NAPROSYN) 500 MG tablet Take 1 tablet (500 mg total) by mouth 2 (two)  times daily. 06/04/20  Yes Renne Crigler, PA-C  acetaminophen (TYLENOL) 500 MG tablet Take 1 tablet (500 mg total) by mouth every 6 (six) hours as needed. 10/18/19   Fawze, Mina A, PA-C  cyclobenzaprine (FLEXERIL) 10 MG tablet Take 1 tablet (10 mg total) by mouth 2 (two) times daily as needed for muscle spasms. 10/18/19   Fawze, Mina A, PA-C  ibuprofen (ADVIL) 600 MG tablet Take 1 tablet (600 mg total) by mouth every 6 (six) hours as needed. 10/18/19   Fawze, Mina A, PA-C  ondansetron (ZOFRAN) 4 MG tablet Take 1 tablet (4 mg total) by mouth every 6 (six) hours. 12/20/18   Arlyn Dunning, PA-C    Allergies    Patient has no known allergies.  Review of Systems   Review of Systems  Constitutional: Negative for fever.  HENT: Negative for rhinorrhea and sore throat.   Eyes: Negative for redness.  Respiratory: Negative for cough.   Cardiovascular: Negative for chest pain.  Gastrointestinal: Positive for abdominal pain. Negative for diarrhea, nausea and vomiting.  Genitourinary: Positive for dysuria. Negative for  frequency, hematuria, urgency, vaginal bleeding and vaginal discharge.  Musculoskeletal: Positive for back pain. Negative for myalgias.  Skin: Negative for rash.  Neurological: Negative for headaches.    Physical Exam Updated Vital Signs BP 97/60   Pulse 72   Temp 99.2 F (37.3 C) (Oral)   Resp 18   Ht 5\' 6"  (1.676 m)   Wt 122.5 kg   SpO2 100%   BMI 43.59 kg/m   Physical Exam Vitals and nursing note reviewed.  Constitutional:      General: She is not in acute distress.    Appearance: She is well-developed.  HENT:     Head: Normocephalic and atraumatic.     Right Ear: External ear normal.     Left Ear: External ear normal.     Nose: Nose normal.  Eyes:     Conjunctiva/sclera: Conjunctivae normal.  Cardiovascular:     Rate and Rhythm: Normal rate and regular rhythm.     Heart sounds: No murmur heard.   Pulmonary:     Effort: No respiratory distress.     Breath  sounds: No wheezing, rhonchi or rales.  Abdominal:     Palpations: Abdomen is soft.     Tenderness: There is abdominal tenderness (Mild suprapubic tenderness) in the suprapubic area. There is no right CVA tenderness, left CVA tenderness, guarding or rebound.  Musculoskeletal:     Cervical back: Normal range of motion and neck supple. No tenderness. Normal range of motion.     Thoracic back: No tenderness or bony tenderness.     Lumbar back: Tenderness present.     Right lower leg: No edema.     Left lower leg: No edema.     Comments: Patient with fairly localized area of tenderness in the lower back over the sacral area.  No lumbar paraspinous tenderness.  No skin findings.  Skin:    General: Skin is warm and dry.     Findings: No rash.  Neurological:     General: No focal deficit present.     Mental Status: She is alert. Mental status is at baseline.     Motor: No weakness.  Psychiatric:        Mood and Affect: Mood normal.     ED Results / Procedures / Treatments   Labs (all labs ordered are listed, but only abnormal results are displayed) Labs Reviewed  COMPREHENSIVE METABOLIC PANEL - Abnormal; Notable for the following components:      Result Value   Calcium 8.8 (*)    All other components within normal limits  CBC - Abnormal; Notable for the following components:   WBC 11.5 (*)    All other components within normal limits  URINALYSIS, ROUTINE W REFLEX MICROSCOPIC - Abnormal; Notable for the following components:   APPearance CLOUDY (*)    Hgb urine dipstick SMALL (*)    Leukocytes,Ua LARGE (*)    WBC, UA >50 (*)    Bacteria, UA RARE (*)    Non Squamous Epithelial 0-5 (*)    All other components within normal limits  URINE CULTURE  LIPASE, BLOOD  I-STAT BETA HCG BLOOD, ED ([facility], [facility], [facility] ONLY)    EKG None  Radiology No results found.  Procedures Procedures   Medications Ordered in ED Medications  cephALEXin (KEFLEX) capsule 500 mg (500 mg Oral Given 06/04/20  0258)  naproxen (NAPROSYN) tablet 500 mg (500 mg Oral Given 06/04/20 0259)    ED Course  I have reviewed the triage vital  signs and the nursing notes.  Pertinent labs & imaging results that were available during my care of the patient were reviewed by me and considered in my medical decision making (see chart for details).  Patient seen and examined.  Reviewed results with patient at bedside.  Overall these are reassuring.  Patient chronically has white blood cells in the urine, however given report of suprapubic pain and dysuria, will treat for UTI.  I do not suspect that she has pyelonephritis at this time.  Will give naproxen for pain.  Vital signs reviewed and are as follows: BP 97/60   Pulse 72   Temp 99.2 F (37.3 C) (Oral)   Resp 18   Ht 5\' 6"  (1.676 m)   Wt 122.5 kg   SpO2 100%   BMI 43.59 kg/m   Encourage PCP follow-up in the next 3 days if not improving. The patient was urged to return to the Emergency Department immediately with worsening of current symptoms, worsening abdominal pain, persistent vomiting, blood noted in stools, fever, or any other concerns. The patient verbalized understanding.      MDM Rules/Calculators/A&P                          Suprapubic abdominal pain, dysuria: Urine with white blood cell count.  Previous culture grew multiple species.  Will start on Keflex.  No fevers, flank pain or vomiting to suggest pyelonephritis.  Patient denies concern for STI, vaginal bleeding or discharge.  Lower back pain: Reproducible on exam in the sacral, tailbone area.  Patient denies trauma.  No red flag symptoms of back pain.   Final Clinical Impression(s) / ED Diagnoses Final diagnoses:  Acute midline low back pain without sciatica  Acute cystitis without hematuria    Rx / DC Orders ED Discharge Orders         Ordered    cephALEXin (KEFLEX) 500 MG capsule  3 times daily        06/04/20 0335    naproxen (NAPROSYN) 500 MG tablet  2 times daily         06/04/20 0335           06/06/20, PA-C 06/04/20 0343    Long, 06/06/20, MD 06/04/20 [phone]

## 2020-06-04 NOTE — ED Triage Notes (Signed)
Brought in by [facility] from home, c/o generalized abdominal and back pain. Denies n/v/d.    Not in any distress.

## 2020-06-04 NOTE — Discharge Instructions (Signed)
Please read and follow all provided instructions.  Your diagnoses today include:  1. Acute midline low back pain without sciatica   2. Acute cystitis without hematuria     Tests performed today include: Blood cell counts (white, red, and platelets) Electrolytes  Kidney function test Urine test to check for infection - infection fighting cells seen  Vital signs. See below for your results today.   Medications prescribed:   Take any prescribed medications only as directed.  Home care instructions:  Follow any educational materials contained in this packet.  BE VERY CAREFUL not to take multiple medicines containing Tylenol (also called acetaminophen). Doing so can lead to an overdose which can damage your liver and cause liver failure and possibly death.   Follow-up instructions: Please follow-up with your primary care provider in the next 3 days for further evaluation of your symptoms.   Return instructions:   Please return to the Emergency Department if you experience worsening symptoms.   Please return if you have any other emergent concerns.  Additional Information:  Your vital signs today were: BP 97/60   Pulse 72   Temp 99.2 F (37.3 C) (Oral)   Resp 18   Ht 5\' 6"  (1.676 m)   Wt 122.5 kg   SpO2 100%   BMI 43.59 kg/m  If your blood pressure (BP) was elevated above 135/85 this visit, please have this repeated by your doctor within one month. --------------

## 2020-06-04 NOTE — ED Notes (Signed)
Pt states her last period was April 2021 and is having painful urination to the point she feels like it is cramping.

## 2020-06-05 LAB — URINE CULTURE: Culture: 10000 — AB

## 2020-09-22 ENCOUNTER — Encounter (HOSPITAL_COMMUNITY): Payer: Self-pay

## 2020-09-22 ENCOUNTER — Emergency Department (HOSPITAL_COMMUNITY)
Admission: EM | Admit: 2020-09-22 | Discharge: 2020-09-22 | Disposition: A | Discharge status: Home / Self Care | Payer: Medicaid Other | Attending: Emergency Medicine | Admitting: Emergency Medicine

## 2020-09-22 ENCOUNTER — Other Ambulatory Visit: Payer: Self-pay

## 2020-09-22 DIAGNOSIS — R1031 Right lower quadrant pain: Secondary | ICD-10-CM | POA: Diagnosis present

## 2020-09-22 DIAGNOSIS — Z5321 Procedure and treatment not carried out due to patient leaving prior to being seen by health care provider: Secondary | ICD-10-CM | POA: Insufficient documentation

## 2020-09-22 DIAGNOSIS — R112 Nausea with vomiting, unspecified: Secondary | ICD-10-CM | POA: Diagnosis not present

## 2020-09-22 LAB — CBC
HCT: 43 % (ref 36.0–46.0)
Hemoglobin: 13.8 g/dL (ref 12.0–15.0)
MCH: 30.1 pg (ref 26.0–34.0)
MCHC: 32.1 g/dL (ref 30.0–36.0)
MCV: 93.7 fL (ref 80.0–100.0)
Platelets: 200 10*3/uL (ref 150–400)
RBC: 4.59 MIL/uL (ref 3.87–5.11)
RDW: 13 % (ref 11.5–15.5)
WBC: 8.5 10*3/uL (ref 4.0–10.5)
nRBC: 0 % (ref 0.0–0.2)

## 2020-09-22 LAB — COMPREHENSIVE METABOLIC PANEL
ALT: 11 U/L (ref 0–44)
AST: 16 U/L (ref 15–41)
Albumin: 3.5 g/dL (ref 3.5–5.0)
Alkaline Phosphatase: 61 U/L (ref 38–126)
Anion gap: 7 (ref 5–15)
BUN: 9 mg/dL (ref 6–20)
CO2: 23 mmol/L (ref 22–32)
Calcium: 8.3 mg/dL — ABNORMAL LOW (ref 8.9–10.3)
Chloride: 109 mmol/L (ref 98–111)
Creatinine, Ser: 0.83 mg/dL (ref 0.44–1.00)
GFR, Estimated: >60 mL/min (ref >60)
Glucose, Bld: 97 mg/dL (ref 70–99)
Potassium: 4.2 mmol/L (ref 3.5–5.1)
Sodium: 139 mmol/L (ref 135–145)
Total Bilirubin: 0.7 mg/dL (ref 0.3–1.2)
Total Protein: 6.7 g/dL (ref 6.5–8.1)

## 2020-09-22 LAB — URINALYSIS, ROUTINE W REFLEX MICROSCOPIC
Bacteria, UA: NONE SEEN
Bilirubin Urine: NEGATIVE
Glucose, UA: NEGATIVE mg/dL
Hgb urine dipstick: NEGATIVE
Ketones, ur: NEGATIVE mg/dL
Nitrite: NEGATIVE
Protein, ur: NEGATIVE mg/dL
Specific Gravity, Urine: 1.024 (ref 1.005–1.030)
pH: 7 (ref 5.0–8.0)

## 2020-09-22 LAB — I-STAT BETA HCG BLOOD, ED (MC, WL, AP ONLY): I-stat hCG, quantitative: <5 m[IU]/mL (ref <5)

## 2020-09-22 LAB — LIPASE, BLOOD: Lipase: 26 U/L (ref 11–51)

## 2020-09-22 NOTE — ED Notes (Signed)
Pt left and stated that she is leaving to go to [employer].

## 2020-09-22 NOTE — ED Triage Notes (Signed)
Patient complains of RLQ pain that started last pm. Reports nausea with emesis x 2 since start of illness. Patient states that she saw blood streaks in the emesis. Denies radiation of pain and denies dysuria.
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
## 2020-11-06 ENCOUNTER — Emergency Department (HOSPITAL_COMMUNITY)
Admission: EM | Admit: 2020-11-06 | Discharge: 2020-11-07 | Disposition: A | Discharge status: Home / Self Care | Payer: Medicaid Other | Attending: Emergency Medicine | Admitting: Emergency Medicine

## 2020-11-06 ENCOUNTER — Emergency Department (HOSPITAL_COMMUNITY): Payer: Medicaid Other

## 2020-11-06 DIAGNOSIS — R1013 Epigastric pain: Secondary | ICD-10-CM | POA: Insufficient documentation

## 2020-11-06 DIAGNOSIS — R1011 Right upper quadrant pain: Secondary | ICD-10-CM | POA: Diagnosis not present

## 2020-11-06 DIAGNOSIS — Z5321 Procedure and treatment not carried out due to patient leaving prior to being seen by health care provider: Secondary | ICD-10-CM | POA: Diagnosis not present

## 2020-11-06 DIAGNOSIS — R112 Nausea with vomiting, unspecified: Secondary | ICD-10-CM | POA: Insufficient documentation

## 2020-11-06 LAB — CBC WITH DIFFERENTIAL/PLATELET
Abs Immature Granulocytes: 0.04 10*3/uL (ref 0.00–0.07)
Basophils Absolute: 0 10*3/uL (ref 0.0–0.1)
Basophils Relative: 0 %
Eosinophils Absolute: 0.1 10*3/uL (ref 0.0–0.5)
Eosinophils Relative: 1 %
HCT: 41.9 % (ref 36.0–46.0)
Hemoglobin: 13.4 g/dL (ref 12.0–15.0)
Immature Granulocytes: 0 %
Lymphocytes Relative: 30 %
Lymphs Abs: 3.3 10*3/uL (ref 0.7–4.0)
MCH: 29.8 pg (ref 26.0–34.0)
MCHC: 32 g/dL (ref 30.0–36.0)
MCV: 93.3 fL (ref 80.0–100.0)
Monocytes Absolute: 0.8 10*3/uL (ref 0.1–1.0)
Monocytes Relative: 7 %
Neutro Abs: 6.9 10*3/uL (ref 1.7–7.7)
Neutrophils Relative %: 62 %
Platelets: 207 10*3/uL (ref 150–400)
RBC: 4.49 MIL/uL (ref 3.87–5.11)
RDW: 13 % (ref 11.5–15.5)
WBC: 11.2 10*3/uL — ABNORMAL HIGH (ref 4.0–10.5)
nRBC: 0 % (ref 0.0–0.2)

## 2020-11-06 LAB — COMPREHENSIVE METABOLIC PANEL
ALT: 14 U/L (ref 0–44)
AST: 15 U/L (ref 15–41)
Albumin: 3.6 g/dL (ref 3.5–5.0)
Alkaline Phosphatase: 60 U/L (ref 38–126)
Anion gap: 6 (ref 5–15)
BUN: 14 mg/dL (ref 6–20)
CO2: 25 mmol/L (ref 22–32)
Calcium: 8.3 mg/dL — ABNORMAL LOW (ref 8.9–10.3)
Chloride: 106 mmol/L (ref 98–111)
Creatinine, Ser: 0.91 mg/dL (ref 0.44–1.00)
GFR, Estimated: >60 mL/min (ref >60)
Glucose, Bld: 94 mg/dL (ref 70–99)
Potassium: 4.3 mmol/L (ref 3.5–5.1)
Sodium: 137 mmol/L (ref 135–145)
Total Bilirubin: 0.5 mg/dL (ref 0.3–1.2)
Total Protein: 7 g/dL (ref 6.5–8.1)

## 2020-11-06 LAB — URINALYSIS, MICROSCOPIC (REFLEX): Bacteria, UA: NONE SEEN

## 2020-11-06 LAB — URINALYSIS, ROUTINE W REFLEX MICROSCOPIC
Bilirubin Urine: NEGATIVE
Glucose, UA: NEGATIVE mg/dL
Ketones, ur: NEGATIVE mg/dL
Nitrite: NEGATIVE
Protein, ur: 30 mg/dL — AB
Specific Gravity, Urine: >1.03 — ABNORMAL HIGH (ref 1.005–1.030)
pH: 6 (ref 5.0–8.0)

## 2020-11-06 LAB — I-STAT BETA HCG BLOOD, ED (MC, WL, AP ONLY): I-stat hCG, quantitative: <5 m[IU]/mL (ref <5)

## 2020-11-06 LAB — LIPASE, BLOOD: Lipase: 35 U/L (ref 11–51)

## 2020-11-06 NOTE — ED Triage Notes (Signed)
Pt here from home with c/o n/v over the last few days thought she may have seem some blood in her vomit today

## 2020-11-06 NOTE — ED Provider Notes (Signed)
Emergency Medicine Provider Triage Evaluation Note  Robin Petersen , a 30 y.o. female  was evaluated in triage.  Pt complains of abdominal pain.  Intermittent nature.  Located to RUQ and epigastric region.  Worse with p.o. intake. Multiple episodes of NBNB emesis. No CP, SOB, cough, back pain, urinary complaints. No prior hx of gallstones  Review of Systems  Positive: RUQ pain, emesis Negative: Fever, back pain, diarrhea, dysuria  Physical Exam  BP 104/64 (BP Location: Right Arm)   Pulse 85   Temp 98.8 F (37.1 C) (Oral)   Resp 18   SpO2 99%  Gen:   Awake, no distress   Resp:  Normal effort  MSK:   Moves extremities without difficulty  ABD:  Diffuse tenderness to upper abdomen  Other:    Medical Decision Making  Medically screening exam initiated at 7:56 PM.  Appropriate orders placed.  Frederik Pear Kristiane Morsch was informed that the remainder of the evaluation will be completed by another provider, this initial triage assessment does not replace that evaluation, and the importance of remaining in the ED until their evaluation is complete.  Abd pain, emesis   Soma Bachand A, PA-C 11/06/20 1958    Terald Sleeper, MD 11/06/20 2045

## 2020-12-15 ENCOUNTER — Other Ambulatory Visit: Payer: Self-pay

## 2020-12-15 ENCOUNTER — Emergency Department (HOSPITAL_COMMUNITY): Payer: Medicaid Other

## 2020-12-15 ENCOUNTER — Encounter (HOSPITAL_COMMUNITY): Payer: Self-pay | Admitting: Emergency Medicine

## 2020-12-15 ENCOUNTER — Emergency Department (HOSPITAL_COMMUNITY)
Admission: EM | Admit: 2020-12-15 | Discharge: 2020-12-15 | Disposition: A | Discharge status: Home / Self Care | Payer: Medicaid Other | Attending: Emergency Medicine | Admitting: Emergency Medicine

## 2020-12-15 DIAGNOSIS — M25511 Pain in right shoulder: Secondary | ICD-10-CM | POA: Insufficient documentation

## 2020-12-15 DIAGNOSIS — F17210 Nicotine dependence, cigarettes, uncomplicated: Secondary | ICD-10-CM | POA: Insufficient documentation

## 2020-12-15 DIAGNOSIS — E119 Type 2 diabetes mellitus without complications: Secondary | ICD-10-CM | POA: Insufficient documentation

## 2020-12-15 DIAGNOSIS — I10 Essential (primary) hypertension: Secondary | ICD-10-CM | POA: Diagnosis not present

## 2020-12-15 MED ORDER — IBUPROFEN 400 MG PO TABS
600.0000 mg | ORAL_TABLET | Freq: Once | ORAL | Status: AC
  Administered 2020-12-15: 600 mg via ORAL
  Filled 2020-12-15: qty 1

## 2020-12-15 NOTE — Discharge Instructions (Signed)
Return for any problem.  ?

## 2020-12-15 NOTE — ED Triage Notes (Addendum)
Pt here for R shoulder sharp shooting pains that started while going home from work. Pt denies any recent injury/trauma, states she injured the shoulder a year ago. Pt reports not being able to move her R arm at all, took tylenol w/o relief.

## 2020-12-15 NOTE — ED Provider Notes (Signed)
Emergency Medicine Provider Triage Evaluation Note  Robin Petersen , a 30 y.o. female  was evaluated in triage.  Pt complains of sudden onset of pain in her right shoulder.  She states that while coming home from work she started having pains in her right shoulder that shoot down her arm.  She denies any recent injury or trauma.  No new activities.  No numbness or weakness however range of motion is limited secondary to pain.  She tried Tylenol without relief.  Review of Systems  Positive: Right arm pain Negative: Numbness, weakness, tingling  Physical Exam  BP 112/88 (BP Location: Left Arm)   Pulse 99   Temp 98.7 F (37.1 C) (Oral)   Resp 18   LMP 11/15/2020 (Approximate)   SpO2 98%  Gen:   Awake, no distress   Resp:  Normal effort  MSK:   Pain when moving right shoulder and upper arm.  Compartments in right upper arm are soft and easily compressible.  She is able to move the fingers on her right hand.  She has tenderness to palpation along the right shoulder anteriorly and posteriorly. Other:  2+ right radial pulse.  Capillary refill to fingers on right hand is under 2 seconds.  Sensation intact to light touch to fingers on right hand.  Medical Decision Making  Medically screening exam initiated at 9:47 PM.  Appropriate orders placed.  Robin Petersen was informed that the remainder of the evaluation will be completed by another provider, this initial triage assessment does not replace that evaluation, and the importance of remaining in the ED until their evaluation is complete.  Note: Portions of this report may have been transcribed using voice recognition software. Every effort was made to ensure accuracy; however, inadvertent computerized transcription errors may be present    Norman Clay 12/15/20 2149    Wynetta Fines, MD 12/15/20 2308

## 2020-12-15 NOTE — ED Provider Notes (Signed)
[facility] EMERGENCY DEPARTMENT Provider Note   CSN: [id number] Arrival date & time: 12/15/20  2051     History Chief Complaint  Patient presents with   Shoulder Pain    Robin Petersen is a 30 y.o. female.  30 year old female with prior medical history as detailed below presents for evaluation.  Patient reports that she does routine heavy lifting on a daily basis.  Patient complains of pain to the anterior aspect of the right shoulder.  Pain is worse with movement.  Pain is improved with holding the shoulder still.  She denies numbness or weakness into the distal right upper extremity.  She took some Tylenol earlier today without significant improvement in her symptoms.  The history is provided by the patient.  Shoulder Pain Location:  Shoulder Shoulder location:  R shoulder Injury: no   Pain details:    Quality:  Aching   Radiates to:  Does not radiate   Severity:  Mild   Onset quality:  Gradual   Duration:  1 day   Timing:  Constant   Progression:  Unchanged     Past Medical History:  Diagnosis Date   Diabetes mellitus without complication (HCC)    Hypotension     There are no problems to display for this patient.   Past Surgical History:  Procedure Laterality Date   CESAREAN SECTION       OB History     Gravida  1   Para      Term      Preterm      AB      Living         SAB      IAB      Ectopic      Multiple      Live Births              History reviewed. No pertinent family history.  Social History   Tobacco Use   Smoking status: Every Day    Packs/day: 0.25    Years: 9.00    Pack years: 2.25    Types: Cigarettes   Smokeless tobacco: Never  Vaping Use   Vaping Use: Never used  Substance Use Topics   Alcohol use: Never   Drug use: Not Currently    Types: Marijuana    Home Medications Prior to Admission medications   Medication Sig Start Date End Date Taking? Authorizing Provider   acetaminophen (TYLENOL) 500 MG tablet Take 1 tablet (500 mg total) by mouth every 6 (six) hours as needed. 10/18/19   Fawze, Mina A, PA-C  cephALEXin (KEFLEX) 500 MG capsule Take 1 capsule (500 mg total) by mouth 3 (three) times daily. 06/04/20   Renne Crigler, PA-C  cyclobenzaprine (FLEXERIL) 10 MG tablet Take 1 tablet (10 mg total) by mouth 2 (two) times daily as needed for muscle spasms. 10/18/19   Fawze, Mina A, PA-C  ibuprofen (ADVIL) 600 MG tablet Take 1 tablet (600 mg total) by mouth every 6 (six) hours as needed. 10/18/19   Fawze, Mina A, PA-C  naproxen (NAPROSYN) 500 MG tablet Take 1 tablet (500 mg total) by mouth 2 (two) times daily. 06/04/20   Renne Crigler, PA-C  ondansetron (ZOFRAN) 4 MG tablet Take 1 tablet (4 mg total) by mouth every 6 (six) hours. 12/20/18   Arlyn Dunning, PA-C    Allergies    Patient has no known allergies.  Review of Systems   Review of Systems  All other systems reviewed and are negative.  Physical Exam Updated Vital Signs BP 112/88 (BP Location: Left Arm)   Pulse 99   Temp 98.7 F (37.1 C) (Oral)   Resp 18   LMP 11/15/2020 (Approximate)   SpO2 98%   Physical Exam Vitals and nursing note reviewed.  Constitutional:      General: She is not in acute distress.    Appearance: Normal appearance. She is well-developed.  HENT:     Head: Normocephalic and atraumatic.  Eyes:     Conjunctiva/sclera: Conjunctivae normal.     Pupils: Pupils are equal, round, and reactive to light.  Cardiovascular:     Rate and Rhythm: Normal rate and regular rhythm.     Heart sounds: Normal heart sounds.  Pulmonary:     Effort: Pulmonary effort is normal. No respiratory distress.     Breath sounds: Normal breath sounds.  Abdominal:     General: There is no distension.     Palpations: Abdomen is soft.     Tenderness: There is no abdominal tenderness.  Musculoskeletal:        General: Tenderness present. No deformity. Normal range of motion.     Cervical back: Normal  range of motion and neck supple.     Comments: Mild diffuse tenderness to the anterior aspect of the right shoulder.  Patient's pain is improved with the shoulder held at rest.  Distal right upper extremity is neurovascular intact.  Skin:    General: Skin is warm and dry.  Neurological:     General: No focal deficit present.     Mental Status: She is alert and oriented to person, place, and time.    ED Results / Procedures / Treatments   Labs (all labs ordered are listed, but only abnormal results are displayed) Labs Reviewed - No data to display  EKG None  Radiology DG Shoulder Right  Result Date: 12/15/2020 CLINICAL DATA:  Right shoulder pain common no known injury, initial encounter EXAM: RIGHT SHOULDER - 2+ VIEW COMPARISON:  None. FINDINGS: There is no evidence of fracture or dislocation. There is no evidence of arthropathy or other focal bone abnormality. Soft tissues are unremarkable. IMPRESSION: No acute abnormality noted. Electronically Signed   By: Alcide Clever M.D.   On: 12/15/2020 22:14    Procedures Procedures   Medications Ordered in ED Medications  ibuprofen (ADVIL) tablet 600 mg (has no administration in time range)    ED Course  I have reviewed the triage vital signs and the nursing notes.  Pertinent labs & imaging results that were available during my care of the patient were reviewed by me and considered in my medical decision making (see chart for details).    MDM Rules/Calculators/A&P                           MDM  MSE complete  Robin Petersen was evaluated in Emergency Department on 12/15/2020 for the symptoms described in the history of present illness. She was evaluated in the context of the global COVID-19 pandemic, which necessitated consideration that the patient might be at risk for infection with the SARS-CoV-2 virus that causes COVID-19. Institutional protocols and algorithms that pertain to the evaluation of patients at risk for COVID-19  are in a state of rapid change based on information released by regulatory bodies including the CDC and federal and state organizations. These policies and algorithms were followed during the patient's care in  the ED.  Patient is presenting with complaint of right shoulder pain.  Her pain is most likely muscular in nature.  She has x-ray imaging without evidence of acute pathology.  She has a only already made outpatient follow-up arrangements with orthopedics.  She is agreeable to use of ibuprofen, sling, and rest until she can follow-up with her orthopedic provider.  Strict return precautions given understood.  Importance of close follow-up is stressed. Final Clinical Impression(s) / ED Diagnoses Final diagnoses:  Acute pain of right shoulder    Rx / DC Orders ED Discharge Orders     None        Wynetta Fines, MD 12/15/20 2300

## 2020-12-28 ENCOUNTER — Other Ambulatory Visit: Payer: Self-pay | Admitting: Surgery

## 2020-12-28 ENCOUNTER — Other Ambulatory Visit (HOSPITAL_COMMUNITY): Payer: Self-pay | Admitting: Surgery

## 2020-12-28 DIAGNOSIS — R1011 Right upper quadrant pain: Secondary | ICD-10-CM

## 2021-01-12 ENCOUNTER — Other Ambulatory Visit: Payer: Self-pay

## 2021-01-12 ENCOUNTER — Encounter (HOSPITAL_COMMUNITY): Payer: Self-pay

## 2021-01-12 ENCOUNTER — Emergency Department (HOSPITAL_COMMUNITY)
Admission: EM | Admit: 2021-01-12 | Discharge: 2021-01-13 | Disposition: A | Discharge status: Home / Self Care | Payer: Medicaid Other | Attending: Student | Admitting: Student

## 2021-01-12 ENCOUNTER — Emergency Department (HOSPITAL_COMMUNITY): Payer: Medicaid Other

## 2021-01-12 DIAGNOSIS — Z20822 Contact with and (suspected) exposure to covid-19: Secondary | ICD-10-CM | POA: Insufficient documentation

## 2021-01-12 DIAGNOSIS — Z7951 Long term (current) use of inhaled steroids: Secondary | ICD-10-CM | POA: Insufficient documentation

## 2021-01-12 DIAGNOSIS — R0789 Other chest pain: Secondary | ICD-10-CM | POA: Insufficient documentation

## 2021-01-12 DIAGNOSIS — R509 Fever, unspecified: Secondary | ICD-10-CM | POA: Insufficient documentation

## 2021-01-12 DIAGNOSIS — Z5321 Procedure and treatment not carried out due to patient leaving prior to being seen by health care provider: Secondary | ICD-10-CM | POA: Diagnosis not present

## 2021-01-12 DIAGNOSIS — R0981 Nasal congestion: Secondary | ICD-10-CM | POA: Diagnosis not present

## 2021-01-12 DIAGNOSIS — R0602 Shortness of breath: Secondary | ICD-10-CM | POA: Insufficient documentation

## 2021-01-12 DIAGNOSIS — R059 Cough, unspecified: Secondary | ICD-10-CM | POA: Insufficient documentation

## 2021-01-12 DIAGNOSIS — J45901 Unspecified asthma with (acute) exacerbation: Secondary | ICD-10-CM | POA: Insufficient documentation

## 2021-01-12 LAB — I-STAT BETA HCG BLOOD, ED (MC, WL, AP ONLY): I-stat hCG, quantitative: 34.8 m[IU]/mL — ABNORMAL HIGH (ref <5)

## 2021-01-12 LAB — RESP PANEL BY RT-PCR (FLU A&B, COVID) ARPGX2
Influenza A by PCR: NEGATIVE
Influenza B by PCR: NEGATIVE
SARS Coronavirus 2 by RT PCR: NEGATIVE

## 2021-01-12 MED ORDER — IPRATROPIUM-ALBUTEROL 0.5-2.5 (3) MG/3ML IN SOLN
3.0000 mL | Freq: Once | RESPIRATORY_TRACT | Status: AC
  Administered 2021-01-12: 3 mL via RESPIRATORY_TRACT

## 2021-01-12 NOTE — ED Triage Notes (Signed)
Pt c/o SOB x3 days. Pt has hx of asthma.

## 2021-01-12 NOTE — ED Provider Notes (Signed)
Emergency Medicine Provider Triage Evaluation Note  Robin Petersen , a 30 y.o. female  was evaluated in triage.  Pt complains of shortness of breath and chest tightness.  Patient states she has a history of asthma and notes this feels similar to past asthma exacerbations.  She has been using her albuterol inhaler with no relief.  No history of blood clots, recent surgeries, recent long immobilizations, or hormonal treatments.  She also endorses a cough, subjective fevers, nasal congestion.  Review of Systems  Positive: SOB Negative: Abdominal pain  Physical Exam  BP 112/85 (BP Location: Left Arm)   Pulse (!) 122   Temp 99.4 F (37.4 C) (Oral)   Resp 12   Ht 5\' 6"  (1.676 m)   Wt 129.7 kg   SpO2 99%   BMI 46.16 kg/m  Gen:   Awake, no distress   Resp:  Normal effort, decreased air movement MSK:   Moves extremities without difficulty  Other:    Medical Decision Making  Medically screening exam initiated at 10:27 PM.  Appropriate orders placed.  Robin Petersen was informed that the remainder of the evaluation will be completed by another provider, this initial triage assessment does not replace that evaluation, and the importance of remaining in the ED until their evaluation is complete.  Nebulizer treatment given in triage CXR COVID test EKG   Robin Petersen 01/12/21 2229    2230, MD 01/28/21 0730

## 2021-01-19 ENCOUNTER — Ambulatory Visit (HOSPITAL_COMMUNITY): Payer: Medicaid Other

## 2021-01-26 ENCOUNTER — Ambulatory Visit (HOSPITAL_COMMUNITY)
Admission: RE | Admit: 2021-01-26 | Discharge: 2021-01-26 | Disposition: A | Discharge status: Home / Self Care | Payer: Medicaid Other | Source: Ambulatory Visit | Attending: Surgery | Admitting: Surgery

## 2021-01-26 ENCOUNTER — Other Ambulatory Visit: Payer: Self-pay

## 2021-01-26 DIAGNOSIS — R1011 Right upper quadrant pain: Secondary | ICD-10-CM | POA: Diagnosis present

## 2021-01-26 MED ORDER — TECHNETIUM TC 99M MEBROFENIN IV KIT
5.4000 | PACK | Freq: Once | INTRAVENOUS | Status: AC | PRN
  Administered 2021-01-26: 5.4 via INTRAVENOUS

## 2021-02-01 ENCOUNTER — Emergency Department (HOSPITAL_COMMUNITY)
Admission: EM | Admit: 2021-02-01 | Discharge: 2021-02-01 | Discharge status: Against Medical Advice | Payer: Medicaid Other | Attending: Emergency Medicine | Admitting: Emergency Medicine

## 2021-02-01 DIAGNOSIS — R1010 Upper abdominal pain, unspecified: Secondary | ICD-10-CM

## 2021-02-01 DIAGNOSIS — Z5321 Procedure and treatment not carried out due to patient leaving prior to being seen by health care provider: Secondary | ICD-10-CM | POA: Insufficient documentation

## 2021-02-01 DIAGNOSIS — R197 Diarrhea, unspecified: Secondary | ICD-10-CM | POA: Insufficient documentation

## 2021-02-01 DIAGNOSIS — R109 Unspecified abdominal pain: Secondary | ICD-10-CM | POA: Diagnosis not present

## 2021-02-01 LAB — COMPREHENSIVE METABOLIC PANEL
ALT: 12 U/L (ref 0–44)
AST: 12 U/L — ABNORMAL LOW (ref 15–41)
Albumin: 3.4 g/dL — ABNORMAL LOW (ref 3.5–5.0)
Alkaline Phosphatase: 66 U/L (ref 38–126)
Anion gap: 8 (ref 5–15)
BUN: 11 mg/dL (ref 6–20)
CO2: 24 mmol/L (ref 22–32)
Calcium: 8.4 mg/dL — ABNORMAL LOW (ref 8.9–10.3)
Chloride: 105 mmol/L (ref 98–111)
Creatinine, Ser: 0.85 mg/dL (ref 0.44–1.00)
GFR, Estimated: >60 mL/min (ref >60)
Glucose, Bld: 102 mg/dL — ABNORMAL HIGH (ref 70–99)
Potassium: 4.1 mmol/L (ref 3.5–5.1)
Sodium: 137 mmol/L (ref 135–145)
Total Bilirubin: 0.7 mg/dL (ref 0.3–1.2)
Total Protein: 6.6 g/dL (ref 6.5–8.1)

## 2021-02-01 LAB — CBC
HCT: 43.6 % (ref 36.0–46.0)
Hemoglobin: 13.7 g/dL (ref 12.0–15.0)
MCH: 29.6 pg (ref 26.0–34.0)
MCHC: 31.4 g/dL (ref 30.0–36.0)
MCV: 94.2 fL (ref 80.0–100.0)
Platelets: 208 10*3/uL (ref 150–400)
RBC: 4.63 MIL/uL (ref 3.87–5.11)
RDW: 12.8 % (ref 11.5–15.5)
WBC: 7.9 10*3/uL (ref 4.0–10.5)
nRBC: 0 % (ref 0.0–0.2)

## 2021-02-01 LAB — LIPASE, BLOOD: Lipase: 27 U/L (ref 11–51)

## 2021-02-01 LAB — I-STAT BETA HCG BLOOD, ED (MC, WL, AP ONLY): I-stat hCG, quantitative: 16.2 m[IU]/mL — ABNORMAL HIGH (ref <5)

## 2021-02-01 NOTE — ED Triage Notes (Signed)
EMS stated, Ive had abdominal pain since last night with diarrhea. She had a scan last Wednesday and doesn't know he results as of now.

## 2021-02-01 NOTE — ED Provider Notes (Signed)
[facility] EMERGENCY DEPARTMENT Provider Note   CSN: [id number] Arrival date & time: 02/01/21  0734     History Chief Complaint  Patient presents with   Abdominal Pain   Diarrhea    Robin Petersen is a 30 y.o. female.  Patient c/o intermittent ruq pain in the past month. Symptoms acute onset, intermittent, dull, moderate, non radiating, without consistent/specific exacerbating or alleviating factors. Symptoms occur at rest, not related to exertion, and not always w eating. States being evaluated for possible gallbladder cause of pain but scans negative. Denies hx pud or pancreatitis. No dysuria or hematuria. No fever or chills. Intermittent diarrhea, loose stools - no bloody bms. +occasional nausea/vomiting, but none today. No vaginal discharge or bleeding, no pelvic/lower abd pain.   The history is provided by the patient, medical records and the EMS personnel.  Abdominal Pain Associated symptoms: diarrhea   Associated symptoms: no chest pain, no chills, no cough, no dysuria, no fever, no shortness of breath, no sore throat, no vaginal bleeding and no vaginal discharge   Diarrhea Associated symptoms: abdominal pain   Associated symptoms: no chills, no fever and no headaches       Past Medical History:  Diagnosis Date   Diabetes mellitus without complication (HCC)    Hypotension     There are no problems to display for this patient.   Past Surgical History:  Procedure Laterality Date   CESAREAN SECTION       OB History     Gravida  1   Para      Term      Preterm      AB      Living         SAB      IAB      Ectopic      Multiple      Live Births              No family history on file.  Social History   Tobacco Use   Smoking status: Every Day    Packs/day: 0.25    Years: 9.00    Pack years: 2.25    Types: Cigarettes   Smokeless tobacco: Never  Vaping Use   Vaping Use: Never used  Substance Use Topics    Alcohol use: Never   Drug use: Not Currently    Types: Marijuana    Home Medications Prior to Admission medications   Medication Sig Start Date End Date Taking? Authorizing Provider  acetaminophen (TYLENOL) 500 MG tablet Take 1 tablet (500 mg total) by mouth every 6 (six) hours as needed. 10/18/19   Fawze, Mina A, PA-C  cephALEXin (KEFLEX) 500 MG capsule Take 1 capsule (500 mg total) by mouth 3 (three) times daily. 06/04/20   Carlisle Cater, PA-C  cyclobenzaprine (FLEXERIL) 10 MG tablet Take 1 tablet (10 mg total) by mouth 2 (two) times daily as needed for muscle spasms. 10/18/19   Fawze, Mina A, PA-C  ibuprofen (ADVIL) 600 MG tablet Take 1 tablet (600 mg total) by mouth every 6 (six) hours as needed. 10/18/19   Fawze, Mina A, PA-C  naproxen (NAPROSYN) 500 MG tablet Take 1 tablet (500 mg total) by mouth 2 (two) times daily. 06/04/20   Carlisle Cater, PA-C  ondansetron (ZOFRAN) 4 MG tablet Take 1 tablet (4 mg total) by mouth every 6 (six) hours. 12/20/18   Alveria Apley, PA-C    Allergies    Patient has no known  allergies.  Review of Systems   Review of Systems  Constitutional:  Negative for chills and fever.  HENT:  Negative for sore throat.   Eyes:  Negative for redness.  Respiratory:  Negative for cough and shortness of breath.   Cardiovascular:  Negative for chest pain.  Gastrointestinal:  Positive for abdominal pain and diarrhea.  Genitourinary:  Negative for dysuria, flank pain, vaginal bleeding and vaginal discharge.  Musculoskeletal:  Negative for back pain.  Skin:  Negative for rash.  Neurological:  Negative for headaches.  Hematological:  Does not bruise/bleed easily.  Psychiatric/Behavioral:  Negative for confusion.    Physical Exam Updated Vital Signs BP 96/69 (BP Location: Left Arm)   Pulse 91   Temp 98.9 F (37.2 C) (Oral)   Resp 16   LMP 12/11/2020   SpO2 98%   Physical Exam Vitals and nursing note reviewed.  Constitutional:      Appearance: Normal appearance.  She is well-developed.  HENT:     Head: Atraumatic.     Nose: Nose normal.     Mouth/Throat:     Mouth: Mucous membranes are moist.  Eyes:     General: No scleral icterus.    Conjunctiva/sclera: Conjunctivae normal.  Neck:     Trachea: No tracheal deviation.  Cardiovascular:     Rate and Rhythm: Normal rate and regular rhythm.     Pulses: Normal pulses.     Heart sounds: Normal heart sounds. No murmur heard.   No friction rub. No gallop.  Pulmonary:     Effort: Pulmonary effort is normal. No respiratory distress.     Breath sounds: Normal breath sounds.  Abdominal:     General: Bowel sounds are normal. There is no distension.     Palpations: Abdomen is soft. There is no mass.     Tenderness: There is no abdominal tenderness. There is no guarding or rebound.     Hernia: No hernia is present.  Genitourinary:    Comments: No cva tenderness.  Musculoskeletal:        General: No swelling.     Cervical back: Normal range of motion and neck supple. No rigidity. No muscular tenderness.  Skin:    General: Skin is warm and dry.     Findings: No rash.  Neurological:     Mental Status: She is alert.     Comments: Alert, speech normal.   Psychiatric:        Mood and Affect: Mood normal.    ED Results / Procedures / Treatments   Labs (all labs ordered are listed, but only abnormal results are displayed) Results for orders placed or performed during the hospital encounter of 02/01/21  Lipase, blood  Result Value Ref Range   Lipase 27 11 - 51 U/L  Comprehensive metabolic panel  Result Value Ref Range   Sodium 137 135 - 145 mmol/L   Potassium 4.1 3.5 - 5.1 mmol/L   Chloride 105 98 - 111 mmol/L   CO2 24 22 - 32 mmol/L   Glucose, Bld 102 (H) 70 - 99 mg/dL   BUN 11 6 - 20 mg/dL   Creatinine, Ser 4.03 0.44 - 1.00 mg/dL   Calcium 8.4 (L) 8.9 - 10.3 mg/dL   Total Protein 6.6 6.5 - 8.1 g/dL   Albumin 3.4 (L) 3.5 - 5.0 g/dL   AST 12 (L) 15 - 41 U/L   ALT 12 0 - 44 U/L   Alkaline  Phosphatase 66 38 - 126 U/L  Total Bilirubin 0.7 0.3 - 1.2 mg/dL   GFR, Estimated >85 >02 mL/min   Anion gap 8 5 - 15  CBC  Result Value Ref Range   WBC 7.9 4.0 - 10.5 K/uL   RBC 4.63 3.87 - 5.11 MIL/uL   Hemoglobin 13.7 12.0 - 15.0 g/dL   HCT 77.4 12.8 - 78.6 %   MCV 94.2 80.0 - 100.0 fL   MCH 29.6 26.0 - 34.0 pg   MCHC 31.4 30.0 - 36.0 g/dL   RDW 76.7 20.9 - 47.0 %   Platelets 208 150 - 400 K/uL   nRBC 0.0 0.0 - 0.2 %  I-Stat beta hCG blood, ED  Result Value Ref Range   I-stat hCG, quantitative 16.2 (H) <5 mIU/mL   Comment 3           NM Hepato W/EF  Result Date: 01/27/2021 CLINICAL DATA:  Chronic right upper quadrant abdominal pain, nausea, vomiting EXAM: NUCLEAR MEDICINE HEPATOBILIARY IMAGING WITH GALLBLADDER EF TECHNIQUE: Sequential images of the abdomen were obtained out to 60 minutes following intravenous administration of radiopharmaceutical. After oral ingestion of Ensure, gallbladder ejection fraction was determined. At 60 min, normal ejection fraction is greater than 33%. RADIOPHARMACEUTICALS:  5.4 mCi Tc-53m  Choletec IV COMPARISON:  None. FINDINGS: Prompt uptake and biliary excretion of activity by the liver is seen. Gallbladder activity is visualized, consistent with patency of cystic duct. Biliary activity passes into small bowel, consistent with patent common bile duct. Calculated gallbladder ejection fraction is 70%. (Normal gallbladder ejection fraction with Ensure is greater than 33%.) IMPRESSION: Normal examination with normal gallbladder contractility. Electronically Signed   By: Helyn Numbers M.D.   On: 01/27/2021 00:41   DG Chest Portable 1 View  Result Date: 01/12/2021 CLINICAL DATA:  Shortness of breath. EXAM: PORTABLE CHEST 1 VIEW COMPARISON:  None. FINDINGS: The heart size and mediastinal contours are within normal limits. Both lungs are clear. The visualized skeletal structures are unremarkable. IMPRESSION: No active disease. Electronically Signed   By:  Aram Candela M.D.   On: 01/12/2021 22:39     EKG None  Radiology No results found.  Procedures Procedures   Medications Ordered in ED Medications - No data to display  ED Course  I have reviewed the triage vital signs and the nursing notes.  Pertinent labs & imaging results that were available during my care of the patient were reviewed by me and considered in my medical decision making (see chart for details).    MDM Rules/Calculators/A&P                          Labs sent. Percocet 1 po.   Reviewed nursing notes and prior charts for additional history.  Recent u/s negative for acute process or gallstones. Recent NM/HIDA scan normal.   Labs reviewed/interpreted by me - wbc and hgb normal, lipase normal.   Went to reassess pt - pt had left ED AMA, prior to completion of evaluation and tx, without me being notified.    Final Clinical Impression(s) / ED Diagnoses Final diagnoses:  None    Rx / DC Orders ED Discharge Orders     None        Cathren Laine, MD 02/14/21 1028

## 2021-02-01 NOTE — ED Notes (Signed)
Patient told this tech "I am going home" this tech encouraged patient to stay, patient refused and said she could not wait any longer due to pain.

## 2021-04-04 ENCOUNTER — Other Ambulatory Visit: Payer: Self-pay

## 2021-04-04 ENCOUNTER — Emergency Department (HOSPITAL_COMMUNITY)
Admission: EM | Admit: 2021-04-04 | Discharge: 2021-04-04 | Disposition: A | Discharge status: Home / Self Care | Payer: Medicaid Other | Attending: Emergency Medicine | Admitting: Emergency Medicine

## 2021-04-04 DIAGNOSIS — Z5321 Procedure and treatment not carried out due to patient leaving prior to being seen by health care provider: Secondary | ICD-10-CM | POA: Diagnosis not present

## 2021-04-04 DIAGNOSIS — N939 Abnormal uterine and vaginal bleeding, unspecified: Secondary | ICD-10-CM | POA: Insufficient documentation

## 2021-04-04 LAB — CBC
HCT: 45.1 % (ref 36.0–46.0)
Hemoglobin: 14.8 g/dL (ref 12.0–15.0)
MCH: 30.3 pg (ref 26.0–34.0)
MCHC: 32.8 g/dL (ref 30.0–36.0)
MCV: 92.4 fL (ref 80.0–100.0)
Platelets: 200 10*3/uL (ref 150–400)
RBC: 4.88 MIL/uL (ref 3.87–5.11)
RDW: 12.8 % (ref 11.5–15.5)
WBC: 7.3 10*3/uL (ref 4.0–10.5)
nRBC: 0 % (ref 0.0–0.2)

## 2021-04-04 LAB — BASIC METABOLIC PANEL
Anion gap: 8 (ref 5–15)
BUN: 7 mg/dL (ref 6–20)
CO2: 24 mmol/L (ref 22–32)
Calcium: 8.5 mg/dL — ABNORMAL LOW (ref 8.9–10.3)
Chloride: 109 mmol/L (ref 98–111)
Creatinine, Ser: 0.95 mg/dL (ref 0.44–1.00)
GFR, Estimated: >60 mL/min (ref >60)
Glucose, Bld: 105 mg/dL — ABNORMAL HIGH (ref 70–99)
Potassium: 4.1 mmol/L (ref 3.5–5.1)
Sodium: 141 mmol/L (ref 135–145)

## 2021-04-04 LAB — I-STAT BETA HCG BLOOD, ED (MC, WL, AP ONLY): I-stat hCG, quantitative: 7.5 m[IU]/mL — ABNORMAL HIGH (ref <5)

## 2021-04-04 NOTE — ED Triage Notes (Signed)
Pt started her period on time on Friday and presents to ED for eval of heavier than normal vaginal bleeding today. Having to change tampon approx every 45 mins.

## 2021-04-04 NOTE — ED Notes (Signed)
Pt stated that she will be leaving and "can return later"

## 2021-05-03 ENCOUNTER — Emergency Department (HOSPITAL_COMMUNITY)
Admission: EM | Admit: 2021-05-03 | Discharge: 2021-05-03 | Disposition: A | Discharge status: Home / Self Care | Payer: Medicaid Other | Attending: Emergency Medicine | Admitting: Emergency Medicine

## 2021-05-03 ENCOUNTER — Encounter (HOSPITAL_COMMUNITY): Payer: Self-pay | Admitting: Emergency Medicine

## 2021-05-03 DIAGNOSIS — N9489 Other specified conditions associated with female genital organs and menstrual cycle: Secondary | ICD-10-CM | POA: Insufficient documentation

## 2021-05-03 DIAGNOSIS — E119 Type 2 diabetes mellitus without complications: Secondary | ICD-10-CM | POA: Diagnosis not present

## 2021-05-03 DIAGNOSIS — J029 Acute pharyngitis, unspecified: Secondary | ICD-10-CM | POA: Diagnosis present

## 2021-05-03 DIAGNOSIS — U071 COVID-19: Secondary | ICD-10-CM | POA: Insufficient documentation

## 2021-05-03 LAB — COMPREHENSIVE METABOLIC PANEL
ALT: 14 U/L (ref 0–44)
AST: 18 U/L (ref 15–41)
Albumin: 3.7 g/dL (ref 3.5–5.0)
Alkaline Phosphatase: 61 U/L (ref 38–126)
Anion gap: 7 (ref 5–15)
BUN: 11 mg/dL (ref 6–20)
CO2: 26 mmol/L (ref 22–32)
Calcium: 8.2 mg/dL — ABNORMAL LOW (ref 8.9–10.3)
Chloride: 106 mmol/L (ref 98–111)
Creatinine, Ser: 0.87 mg/dL (ref 0.44–1.00)
GFR, Estimated: >60 mL/min (ref >60)
Glucose, Bld: 124 mg/dL — ABNORMAL HIGH (ref 70–99)
Potassium: 4.3 mmol/L (ref 3.5–5.1)
Sodium: 139 mmol/L (ref 135–145)
Total Bilirubin: 0.6 mg/dL (ref 0.3–1.2)
Total Protein: 7 g/dL (ref 6.5–8.1)

## 2021-05-03 LAB — URINALYSIS, ROUTINE W REFLEX MICROSCOPIC
Bilirubin Urine: NEGATIVE
Glucose, UA: NEGATIVE mg/dL
Ketones, ur: 5 mg/dL — AB
Nitrite: NEGATIVE
Protein, ur: 30 mg/dL — AB
RBC / HPF: >50 RBC/hpf — ABNORMAL HIGH (ref 0–5)
Specific Gravity, Urine: 1.025 (ref 1.005–1.030)
pH: 5 (ref 5.0–8.0)

## 2021-05-03 LAB — CBC
HCT: 45.6 % (ref 36.0–46.0)
Hemoglobin: 14.7 g/dL (ref 12.0–15.0)
MCH: 30.4 pg (ref 26.0–34.0)
MCHC: 32.2 g/dL (ref 30.0–36.0)
MCV: 94.4 fL (ref 80.0–100.0)
Platelets: 210 10*3/uL (ref 150–400)
RBC: 4.83 MIL/uL (ref 3.87–5.11)
RDW: 12.9 % (ref 11.5–15.5)
WBC: 8.4 10*3/uL (ref 4.0–10.5)
nRBC: 0 % (ref 0.0–0.2)

## 2021-05-03 LAB — I-STAT BETA HCG BLOOD, ED (MC, WL, AP ONLY): I-stat hCG, quantitative: 6.7 m[IU]/mL — ABNORMAL HIGH (ref <5)

## 2021-05-03 LAB — RESP PANEL BY RT-PCR (FLU A&B, COVID) ARPGX2
Influenza A by PCR: NEGATIVE
Influenza B by PCR: NEGATIVE
SARS Coronavirus 2 by RT PCR: POSITIVE — AB

## 2021-05-03 LAB — GROUP A STREP BY PCR: Group A Strep by PCR: NOT DETECTED

## 2021-05-03 LAB — HCG, QUANTITATIVE, PREGNANCY: hCG, Beta Chain, Quant, S: 2 m[IU]/mL (ref <5)

## 2021-05-03 LAB — LIPASE, BLOOD: Lipase: 30 U/L (ref 11–51)

## 2021-05-03 MED ORDER — DEXAMETHASONE 1 MG/ML PO CONC
10.0000 mg | Freq: Once | ORAL | Status: AC
  Administered 2021-05-03: 10 mg via ORAL
  Filled 2021-05-03: qty 10

## 2021-05-03 NOTE — Discharge Instructions (Addendum)
You were seen in the emergency department for sore throat and diarrhea. You have COVID-19. Please quarantine at home for the next 5 days and follow CDC guidelines for returning to the public. You may use tylenol and motrin for fever and body aches. Please use over the counter cough and cold medications to relieve you symptoms. Continue to drink lots of fluids. Please return to the emergency department for worsening shortness of breath or difficulty breathing

## 2021-05-03 NOTE — ED Provider Notes (Signed)
[facility] EMERGENCY DEPARTMENT Provider Note   CSN: [id number] Arrival date & time: 05/03/21  1304     History  Chief Complaint  Patient presents with   Diarrhea    Robin Petersen is a 31 y.o. female. With past medical history of DM who presents to the emergency department with sore throat.   Patient states that beginning Friday she has had sore throat. She states she has had some pain with swallowing. Tolerating foods and liquids. Endorses low grade fever at home of 99. She states that in addition to her sore throat, she had had diarrhea. States this started after sore throat. States every time she eats she needs to "rush to the bathroom" to have bowel movement. No blood in stool. Denies abdominal pain, nausea or vomiting. No urinary symptoms. No chest pain or shortness of breath.    Diarrhea Associated symptoms: fever   Associated symptoms: no abdominal pain and no headaches       Home Medications Prior to Admission medications   Medication Sig Start Date End Date Taking? Authorizing Provider  acetaminophen (TYLENOL) 500 MG tablet Take 1 tablet (500 mg total) by mouth every 6 (six) hours as needed. 10/18/19   Fawze, Mina A, PA-C  cephALEXin (KEFLEX) 500 MG capsule Take 1 capsule (500 mg total) by mouth 3 (three) times daily. 06/04/20   Carlisle Cater, PA-C  cyclobenzaprine (FLEXERIL) 10 MG tablet Take 1 tablet (10 mg total) by mouth 2 (two) times daily as needed for muscle spasms. 10/18/19   Fawze, Mina A, PA-C  ibuprofen (ADVIL) 600 MG tablet Take 1 tablet (600 mg total) by mouth every 6 (six) hours as needed. 10/18/19   Fawze, Mina A, PA-C  naproxen (NAPROSYN) 500 MG tablet Take 1 tablet (500 mg total) by mouth 2 (two) times daily. 06/04/20   Carlisle Cater, PA-C  ondansetron (ZOFRAN) 4 MG tablet Take 1 tablet (4 mg total) by mouth every 6 (six) hours. 12/20/18   Alveria Apley, PA-C      Allergies    Patient has no known allergies.    Review of Systems    Review of Systems  Constitutional:  Positive for fever.  HENT:  Positive for sore throat.   Respiratory:  Negative for cough and shortness of breath.   Cardiovascular:  Negative for chest pain.  Gastrointestinal:  Positive for diarrhea. Negative for abdominal pain.  Genitourinary:  Negative for dysuria.  Neurological:  Negative for headaches.  All other systems reviewed and are negative.  Physical Exam Updated Vital Signs BP 97/64 (BP Location: Right Arm)    Pulse (!) 105    Temp 98.6 F (37 C) (Oral)    Resp 16    SpO2 96%  Physical Exam Vitals and nursing note reviewed.  Constitutional:      General: She is not in acute distress.    Appearance: Normal appearance. She is not ill-appearing or toxic-appearing.  HENT:     Head: Normocephalic and atraumatic.     Nose: Nose normal.     Mouth/Throat:     Mouth: Mucous membranes are moist.     Pharynx: Uvula midline. Posterior oropharyngeal erythema present. No uvula swelling.     Tonsils: No tonsillar exudate or tonsillar abscesses. 2+ on the right. 2+ on the left.  Eyes:     General: No scleral icterus.    Extraocular Movements: Extraocular movements intact.  Cardiovascular:     Rate and Rhythm: Normal rate and regular rhythm.  Pulses: Normal pulses.     Heart sounds: No murmur heard. Pulmonary:     Effort: Pulmonary effort is normal. No respiratory distress.     Breath sounds: Normal breath sounds.  Abdominal:     General: Bowel sounds are normal. There is no distension.     Palpations: Abdomen is soft.     Tenderness: There is no abdominal tenderness.  Musculoskeletal:        General: Normal range of motion.     Cervical back: Neck supple.  Skin:    General: Skin is warm and dry.     Capillary Refill: Capillary refill takes less than 2 seconds.     Findings: No rash.  Neurological:     General: No focal deficit present.     Mental Status: She is alert and oriented to person, place, and time. Mental status is at  baseline.  Psychiatric:        Mood and Affect: Mood normal.        Behavior: Behavior normal.        Thought Content: Thought content normal.        Judgment: Judgment normal.    ED Results / Procedures / Treatments   Labs (all labs ordered are listed, but only abnormal results are displayed) Labs Reviewed  RESP PANEL BY RT-PCR (FLU A&B, COVID) ARPGX2 - Abnormal; Notable for the following components:      Result Value   SARS Coronavirus 2 by RT PCR POSITIVE (*)    All other components within normal limits  COMPREHENSIVE METABOLIC PANEL - Abnormal; Notable for the following components:   Glucose, Bld 124 (*)    Calcium 8.2 (*)    All other components within normal limits  URINALYSIS, ROUTINE W REFLEX MICROSCOPIC - Abnormal; Notable for the following components:   Color, Urine AMBER (*)    APPearance CLOUDY (*)    Hgb urine dipstick LARGE (*)    Ketones, ur 5 (*)    Protein, ur 30 (*)    Leukocytes,Ua LARGE (*)    RBC / HPF >50 (*)    Bacteria, UA FEW (*)    Non Squamous Epithelial 0-5 (*)    All other components within normal limits  I-STAT BETA HCG BLOOD, ED ([facility], [facility], [facility] ONLY) - Abnormal; Notable for the following components:   I-stat hCG, quantitative 6.7 (*)    All other components within normal limits  GROUP A STREP BY PCR  LIPASE, BLOOD  CBC  HCG, QUANTITATIVE, PREGNANCY   EKG None  Radiology No results found.  Procedures Procedures   Medications Ordered in ED Medications  dexamethasone (DECADRON) 10 MG/ML injection for Pediatric ORAL use 10 mg (has no administration in time range)    ED Course/ Medical Decision Making/ A&P                           Medical Decision Making Amount and/or Complexity of Data Reviewed Labs: ordered.  Risk Prescription drug management.  Patient presents to the ED with complaints of sore throat. This involves an extensive number of treatment options, and is a complaint that carries with it a low risk of complications and  morbidity.   Additional history obtained:  Additional history obtained from: none External records from outside source obtained and reviewed including: previous ED visits   Lab Results: I personally ordered, reviewed, and interpreted labs. Pertinent results include: CBC within normal limits CMP within normal limits Lipase  negative Initial i-STAT hCG 6.7, hCG quant 2, negative UA with large leukocytosis however does not appear to have UTI Group A strep negative COVID-positive  Medications  I ordered medication including Decadron for tonsillar swelling Reevaluation of the patient after medication shows that patient improved  ED Course: 31 year old female who presents to the emergency department with sore throat and diarrhea  Broad work-up was started in triage with complaint of diarrhea, however I suspect the diarrhea is from viral illness and not from acute/emergent intra-abdominal pathology.  When patient was brought back to reading, primary complaint is sore throat.  Added on group A strep PCR and COVID/flu.  Patient was found to be COVID-19 positive.  He is otherwise well-appearing and nontoxic in appearance.  Her abdomen is soft and nontender to palpation.  She does not appear to be dehydrated.  She is tolerating fluids.  No leukocytosis on lab work or electrolyte derangements.  I have very low suspicion for intra-abdominal pathology such as diverticulitis, gastroenteritis, acute appendicitis, pancreatitis, hepatobiliary pathology.  Given Decadron for tonsillar swelling.  Discussed findings with patient.  Discussed that she can use over-the-counter cough and cold medication for relief of symptoms.  Return to the emergency department for worsening shortness of breath or difficulty breathing.  She understands discharge instructions.  After consideration of the diagnostic results and the patients response to treatment, I feel that the patent would benefit from discharge. The patient  has been appropriately medically screened and/or stabilized in the ED. I have low suspicion for any other emergent medical condition which would require further screening, evaluation or treatment in the ED or require inpatient management. The patient is overall well appearing and non-toxic in appearance. They are hemodynamically stable at time of discharge.   Final Clinical Impression(s) / ED Diagnoses Final diagnoses:  [id number]    Rx / DC Orders ED Discharge Orders     None         Mickie Hillier, PA-C 05/03/21 1723    Elnora Morrison, MD 05/08/21 2358

## 2021-05-03 NOTE — ED Triage Notes (Addendum)
Patient BIB [facility] from home for sore throat and diarrhea x2 days. Patient has tried nothing to aleve symptoms, symptoms unchanged since onset. Patient alert, oriented, ambulatory, and in no apparent distress at this time.  EMS Vitals BP 128/70 HR 102 98% on room air CBG 118  Patient states she has tried mucinex with no change in symptoms.

## 2021-05-10 ENCOUNTER — Emergency Department (HOSPITAL_COMMUNITY)
Admission: EM | Admit: 2021-05-10 | Discharge: 2021-05-10 | Disposition: A | Discharge status: Home / Self Care | Payer: Medicaid Other | Attending: Emergency Medicine | Admitting: Emergency Medicine

## 2021-05-10 ENCOUNTER — Encounter (HOSPITAL_COMMUNITY): Payer: Self-pay | Admitting: Emergency Medicine

## 2021-05-10 ENCOUNTER — Emergency Department (HOSPITAL_COMMUNITY): Payer: Medicaid Other

## 2021-05-10 DIAGNOSIS — U071 COVID-19: Secondary | ICD-10-CM | POA: Diagnosis not present

## 2021-05-10 DIAGNOSIS — J45909 Unspecified asthma, uncomplicated: Secondary | ICD-10-CM

## 2021-05-10 DIAGNOSIS — R0602 Shortness of breath: Secondary | ICD-10-CM | POA: Diagnosis present

## 2021-05-10 DIAGNOSIS — N9489 Other specified conditions associated with female genital organs and menstrual cycle: Secondary | ICD-10-CM | POA: Insufficient documentation

## 2021-05-10 DIAGNOSIS — E119 Type 2 diabetes mellitus without complications: Secondary | ICD-10-CM | POA: Diagnosis not present

## 2021-05-10 LAB — URINALYSIS, ROUTINE W REFLEX MICROSCOPIC
Glucose, UA: NEGATIVE mg/dL
Ketones, ur: NEGATIVE mg/dL
Nitrite: NEGATIVE
Protein, ur: 30 mg/dL — AB
Specific Gravity, Urine: >1.03 — ABNORMAL HIGH (ref 1.005–1.030)
pH: 5.5 (ref 5.0–8.0)

## 2021-05-10 LAB — BASIC METABOLIC PANEL
Anion gap: 9 (ref 5–15)
BUN: 11 mg/dL (ref 6–20)
CO2: 22 mmol/L (ref 22–32)
Calcium: 8.4 mg/dL — ABNORMAL LOW (ref 8.9–10.3)
Chloride: 107 mmol/L (ref 98–111)
Creatinine, Ser: 0.97 mg/dL (ref 0.44–1.00)
GFR, Estimated: >60 mL/min (ref >60)
Glucose, Bld: 133 mg/dL — ABNORMAL HIGH (ref 70–99)
Potassium: 3.5 mmol/L (ref 3.5–5.1)
Sodium: 138 mmol/L (ref 135–145)

## 2021-05-10 LAB — CBC
HCT: 44.4 % (ref 36.0–46.0)
Hemoglobin: 14.5 g/dL (ref 12.0–15.0)
MCH: 30 pg (ref 26.0–34.0)
MCHC: 32.7 g/dL (ref 30.0–36.0)
MCV: 91.7 fL (ref 80.0–100.0)
Platelets: 239 10*3/uL (ref 150–400)
RBC: 4.84 MIL/uL (ref 3.87–5.11)
RDW: 12.9 % (ref 11.5–15.5)
WBC: 10.9 10*3/uL — ABNORMAL HIGH (ref 4.0–10.5)
nRBC: 0 % (ref 0.0–0.2)

## 2021-05-10 LAB — URINALYSIS, MICROSCOPIC (REFLEX): WBC, UA: >50 WBC/hpf (ref 0–5)

## 2021-05-10 LAB — I-STAT BETA HCG BLOOD, ED (MC, WL, AP ONLY): I-stat hCG, quantitative: <5 m[IU]/mL (ref <5)

## 2021-05-10 MED ORDER — PREDNISONE 20 MG PO TABS: 40.0000 mg | ORAL_TABLET | Freq: Every day | ORAL | 0 refills | Status: DC

## 2021-05-10 NOTE — ED Notes (Signed)
This RN to check pulse ox with ambulation per order. O2 sats 99%-100%RA, HR 98-108BPM. Pt reports SHOB with ambulation, but no s/s of distress noted .

## 2021-05-10 NOTE — ED Provider Notes (Signed)
[facility] EMERGENCY DEPARTMENT Provider Note   CSN: [id number] Arrival date & time: 05/10/21  0715     History  Chief Complaint  Patient presents with   Shortness of Breath    Robin Petersen is a 31 y.o. female.   Shortness of Breath Associated symptoms: no abdominal pain   Patient presents with shortness of breath over around the last week.  Around a week ago was diagnosed with COVID.  Has a history of asthma.  States she is becoming more short of breath over the last few days.  States she feels wheezy.  States she feels tight.  States she feels if her asthma is flaring up.  Used her inhaler this morning.  No nausea or vomiting.  Denies dysuria.  Denies vaginal discharge.   Past Medical History:  Diagnosis Date   Diabetes mellitus without complication (HCC)    Hypotension     Home Medications Prior to Admission medications   Medication Sig Start Date End Date Taking? Authorizing Provider  predniSONE (DELTASONE) 20 MG tablet Take 2 tablets (40 mg total) by mouth daily. 05/10/21  Yes Benjiman Core, MD  acetaminophen (TYLENOL) 500 MG tablet Take 1 tablet (500 mg total) by mouth every 6 (six) hours as needed. 10/18/19   Fawze, Mina A, PA-C  cephALEXin (KEFLEX) 500 MG capsule Take 1 capsule (500 mg total) by mouth 3 (three) times daily. 06/04/20   Renne Crigler, PA-C  cyclobenzaprine (FLEXERIL) 10 MG tablet Take 1 tablet (10 mg total) by mouth 2 (two) times daily as needed for muscle spasms. 10/18/19   Fawze, Mina A, PA-C  ibuprofen (ADVIL) 600 MG tablet Take 1 tablet (600 mg total) by mouth every 6 (six) hours as needed. 10/18/19   Fawze, Mina A, PA-C  naproxen (NAPROSYN) 500 MG tablet Take 1 tablet (500 mg total) by mouth 2 (two) times daily. 06/04/20   Renne Crigler, PA-C  ondansetron (ZOFRAN) 4 MG tablet Take 1 tablet (4 mg total) by mouth every 6 (six) hours. 12/20/18   Arlyn Dunning, PA-C      Allergies    Patient has no known allergies.    Review  of Systems   Review of Systems  Constitutional:  Negative for appetite change.  Respiratory:  Positive for shortness of breath.   Gastrointestinal:  Negative for abdominal pain.  Genitourinary:  Negative for flank pain.  Musculoskeletal:  Negative for back pain.  Neurological:  Negative for weakness.   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  Pulmonary:     Comments: Mildly harsh breath sounds without focal rales or rhonchi. Chest:     Chest wall: No tenderness.  Abdominal:     Tenderness: There is no abdominal tenderness.  Musculoskeletal:     Right lower leg: No edema.  Skin:    General: Skin is warm.     Capillary Refill: Capillary refill takes less than 2 seconds.  Neurological:     Mental Status: She is alert.    ED Results / Procedures / Treatments   Labs (all labs ordered are listed, but only abnormal results are displayed) Labs Reviewed  BASIC METABOLIC PANEL - Abnormal; Notable for the following  components:      Result Value   Glucose, Bld 133 (*)    Calcium 8.4 (*)    All other components within normal limits  CBC - Abnormal; Notable for the following components:   WBC 10.9 (*)    All other components within normal limits  URINALYSIS, ROUTINE W REFLEX MICROSCOPIC - Abnormal; Notable for the following components:   Color, Urine AMBER (*)    APPearance TURBID (*)    Specific Gravity, Urine >1.030 (*)    Hgb urine dipstick TRACE (*)    Bilirubin Urine SMALL (*)    Protein, ur 30 (*)    Leukocytes,Ua MODERATE (*)    All other components within normal limits  URINALYSIS, MICROSCOPIC (REFLEX) - Abnormal; Notable for the following components:   Bacteria, UA MANY (*)    Trichomonas, UA PRESENT (*)    All other components within normal limits  I-STAT BETA HCG  BLOOD, ED ([facility], [facility], [facility] ONLY)    EKG EKG Interpretation  Date/Time:  Tuesday May 10 2021 07:22:04 EST Ventricular Rate:  124 PR Interval:  132 QRS Duration: 70 QT Interval:  280 QTC Calculation: 402 R Axis:   2 Text Interpretation: Sinus tachycardia T wave abnormality, consider inferior ischemia Abnormal ECG When compared with ECG of 12-Jan-2021 22:37, PREVIOUS ECG IS PRESENT No significant change since last tracing Confirmed by Benjiman Core [phone]) on 05/10/2021 9:25:58 AM  Radiology DG Chest 2 View  Result Date: 05/10/2021 CLINICAL DATA:  Cough and shortness of breath.  COVID positive. EXAM: CHEST - 2 VIEW COMPARISON:  01/12/2021 FINDINGS: The cardiac silhouette, mediastinal and hilar contours are within normal limits given AP projection. The lungs are clear of acute process. No infiltrates, effusions or edema. The bony thorax is intact. IMPRESSION: No acute cardiopulmonary findings. Electronically Signed   By: Rudie Meyer M.D.   On: 05/10/2021 10:02    Procedures Procedures    Medications Ordered in ED Medications - No data to display  ED Course/ Medical Decision Making/ A&P                           Medical Decision Making Amount and/or Complexity of Data Reviewed Labs: ordered. Radiology: ordered.  Risk Prescription drug management.   Patient presents with shortness of breath.  History of asthma.  Recently diagnosed with COVID.  Diagnosed with about a week ago and states she is not really feeling better.  States she feels more wheezing now than she did before.  Chest x-ray independently interpreted by me and did not show infiltrate or pneumothorax.  Not hypoxic.  No urinary symptoms.  No dysuria.  Urine did show potential trichomoniasis but denies any risk for STDs or vaginal discharge.  Can follow-up as an outpatient for that.  Given steroids to help with the likely asthma flaring up from her COVID.  Discharge home.        Final Clinical Impression(s) /  ED Diagnoses Final diagnoses:  COVID-19  Asthma, unspecified asthma severity, unspecified whether complicated, unspecified whether persistent    Rx / DC Orders ED Discharge Orders          Ordered    predniSONE (DELTASONE) 20 MG tablet  Daily        05/10/21 1016              Benjiman Core, MD 05/11/21 [phone]

## 2021-05-10 NOTE — ED Notes (Signed)
Pt d/c home per MD order,. Discharge summary reviewed with pt, pt verbalizes understanding. Ambulatory off unit. No s/s of acute distress noted at discharge.  °

## 2021-05-10 NOTE — ED Notes (Signed)
Patient stated that she wanted to step outside.

## 2021-05-10 NOTE — ED Triage Notes (Addendum)
Patient from home with complaint of shortness of breath, diagnosed with COVID-19 on 05/03/2021. Patient states she should be feeling better by today. Patient used six doses of her albuterol inhaler this morning, HR 130 in triage. Patient alert, oriented, and in no apparent distress at this time.

## 2021-06-04 ENCOUNTER — Emergency Department (HOSPITAL_BASED_OUTPATIENT_CLINIC_OR_DEPARTMENT_OTHER)
Admission: EM | Admit: 2021-06-04 | Discharge: 2021-06-04 | Disposition: A | Discharge status: Home / Self Care | Payer: Medicaid Other | Attending: Emergency Medicine | Admitting: Emergency Medicine

## 2021-06-04 ENCOUNTER — Encounter (HOSPITAL_BASED_OUTPATIENT_CLINIC_OR_DEPARTMENT_OTHER): Payer: Self-pay | Admitting: *Deleted

## 2021-06-04 ENCOUNTER — Other Ambulatory Visit: Payer: Self-pay

## 2021-06-04 ENCOUNTER — Emergency Department (HOSPITAL_BASED_OUTPATIENT_CLINIC_OR_DEPARTMENT_OTHER): Payer: Medicaid Other

## 2021-06-04 DIAGNOSIS — M79672 Pain in left foot: Secondary | ICD-10-CM | POA: Diagnosis not present

## 2021-06-04 DIAGNOSIS — W228XXA Striking against or struck by other objects, initial encounter: Secondary | ICD-10-CM | POA: Diagnosis not present

## 2021-06-04 DIAGNOSIS — M25572 Pain in left ankle and joints of left foot: Secondary | ICD-10-CM | POA: Diagnosis present

## 2021-06-04 MED ORDER — CVS CRUTCHES MISC: 1.0000 | Freq: Every day | 0 refills | Status: DC

## 2021-06-04 MED ORDER — IBUPROFEN 600 MG PO TABS: 600.0000 mg | ORAL_TABLET | Freq: Four times a day (QID) | ORAL | 0 refills | Status: DC | PRN

## 2021-06-04 MED ORDER — IBUPROFEN 800 MG PO TABS
800.0000 mg | ORAL_TABLET | Freq: Once | ORAL | Status: AC
  Administered 2021-06-04: 800 mg via ORAL
  Filled 2021-06-04: qty 1

## 2021-06-04 NOTE — ED Notes (Signed)
ED Provider at bedside. 

## 2021-06-04 NOTE — ED Provider Notes (Signed)
?[facility] EMERGENCY DEPT ?Provider Note ? ? ?CSN: [id number] ?Arrival date & time: 06/04/21  1937 ? ?  ? ?History ? ?Chief Complaint  ?Patient presents with  ? Ankle Pain  ? ? ?Leshae Carnisha Feltz is a 31 y.o. female. ? ?Patient is a 31 year old female presenting for ankle pain post injury.  Patient states she was mowing the lawn approximately 3 days ago when she hit a metal pole that hit her left ankle.  Patient has pain at the left lateral malleolus.  Denies any bruising, swelling, redness, or open wounds or abrasions.  Sensation or motor dysfunction. ? ?The history is provided by the patient. No language interpreter was used.  ?Ankle Pain ?Associated symptoms: no fever   ? ?  ? ?Home Medications ?Prior to Admission medications   ?Medication Sig Start Date End Date Taking? Authorizing Provider  ?acetaminophen (TYLENOL) 500 MG tablet Take 1 tablet (500 mg total) by mouth every 6 (six) hours as needed. 10/18/19   Jeanie Sewer, PA-C  ?cephALEXin (KEFLEX) 500 MG capsule Take 1 capsule (500 mg total) by mouth 3 (three) times daily. 06/04/20   Renne Crigler, PA-C  ?cyclobenzaprine (FLEXERIL) 10 MG tablet Take 1 tablet (10 mg total) by mouth 2 (two) times daily as needed for muscle spasms. 10/18/19   Fawze, Mina A, PA-C  ?ibuprofen (ADVIL) 600 MG tablet Take 1 tablet (600 mg total) by mouth every 6 (six) hours as needed. 10/18/19   Fawze, Mina A, PA-C  ?naproxen (NAPROSYN) 500 MG tablet Take 1 tablet (500 mg total) by mouth 2 (two) times daily. 06/04/20   Renne Crigler, PA-C  ?ondansetron (ZOFRAN) 4 MG tablet Take 1 tablet (4 mg total) by mouth every 6 (six) hours. 12/20/18   Arlyn Dunning, PA-C  ?predniSONE (DELTASONE) 20 MG tablet Take 2 tablets (40 mg total) by mouth daily. 05/10/21   Benjiman Core, MD  ?   ? ?Allergies    ?Patient has no known allergies.   ? ?Review of Systems   ?Review of Systems  ?Constitutional:  Negative for chills and fever.  ?Skin:  Negative for color change and wound.   ?Neurological:  Negative for weakness and numbness.  ? ?Physical Exam ?Updated Vital Signs ?BP 108/78 (BP Location: Right Arm)   Pulse (!) 107   Temp 98.1 ?F (36.7 ?C) (Oral)   Resp 16   Ht 5\' 6"  (1.676 m)   Wt 122.5 kg   SpO2 98%   BMI 43.58 kg/m?  ?Physical Exam ?Vitals and nursing note reviewed.  ?Constitutional:   ?   Appearance: Normal appearance.  ?HENT:  ?   Head: Normocephalic and atraumatic.  ?Cardiovascular:  ?   Rate and Rhythm: Normal rate and regular rhythm.  ?   Pulses: Normal pulses.     ?     Dorsalis pedis pulses are 2+ on the right side and 2+ on the left side.  ?   Heart sounds: Normal heart sounds.  ?Musculoskeletal:  ?   Right ankle: Normal.  ?   Left ankle: Tenderness present over the lateral malleolus.  ?   Right foot: Normal. Normal pulse.  ?   Left foot: Tenderness present. Normal pulse.  ?Skin: ?   Capillary Refill: Capillary refill takes less than 2 seconds.  ?Neurological:  ?   General: No focal deficit present.  ?   Mental Status: She is alert and oriented to person, place, and time.  ?   GCS: GCS eye subscore is 4.  GCS verbal subscore is 5. GCS motor subscore is 6.  ?   Sensory: Sensation is intact.  ?   Motor: Motor function is intact.  ? ? ?ED Results / Procedures / Treatments   ?Labs ?(all labs ordered are listed, but only abnormal results are displayed) ?Labs Reviewed - No data to display ? ?EKG ?None ? ?Radiology ?No results found. ? ?Procedures ?Procedures  ? ? ?Medications Ordered in ED ?Medications  ?ibuprofen (ADVIL) tablet 800 mg (has no administration in time range)  ? ? ?ED Course/ Medical Decision Making/ A&P ?  ?                        ?Medical Decision Making ?Amount and/or Complexity of Data Reviewed ?Radiology: ordered. ? ?Risk ?Prescription drug management. ? ? ?33:48 PM ?31 year old female presenting for ankle pain post injury.  Is alert and oriented x3, no acute distress, afebrile, stable vital signs.  Physical exam demonstrates tenderness over the lateral  malleolus of the left leg.  No ecchymosis, redness, swelling, or wounds.  Neurovascularly intact.  X-ray demonstrates no acute process. ? ?Motrin and ice applied.  Ace wrap applied. ? ?Patient in no distress and overall condition improved here in the ED. Detailed discussions were had with the patient regarding current findings, and need for close f/u with PCP or on call doctor. The patient has been instructed to return immediately if the symptoms worsen in any way for re-evaluation. Patient verbalized understanding and is in agreement with current care plan. All questions answered prior to discharge. ? ? ? ? ? ? ? ?Final Clinical Impression(s) / ED Diagnoses ?Final diagnoses:  ?Acute left ankle pain  ? ? ?Rx / DC Orders ?ED Discharge Orders   ? ? None  ? ?  ? ? ?  ?Franne Forts, DO ?06/04/21 2217 ? ?

## 2021-06-04 NOTE — ED Notes (Signed)
EMT-P provided AVS using Teachback Method. Patient verbalizes understanding of Discharge Instructions. Opportunity for Questioning and Answers were provided by EMT-P. Patient Discharged from ED.  ? ?

## 2021-06-04 NOTE — ED Notes (Signed)
L foot elevated and ice applied for comfort  ?

## 2021-06-04 NOTE — ED Triage Notes (Signed)
Patient c/o pain and swelling in left ankle x 3 days after mowing lawn.  Pain not relieved by motrin or tylenol. ? ?BP 136/98 ?98% RA ?100 HR ?

## 2021-08-15 ENCOUNTER — Other Ambulatory Visit: Payer: Self-pay

## 2021-08-15 ENCOUNTER — Emergency Department (HOSPITAL_COMMUNITY)
Admission: EM | Admit: 2021-08-15 | Discharge: 2021-08-15 | Disposition: A | Discharge status: Home / Self Care | Payer: Medicaid Other | Attending: Emergency Medicine | Admitting: Emergency Medicine

## 2021-08-15 ENCOUNTER — Encounter (HOSPITAL_COMMUNITY): Payer: Self-pay

## 2021-08-15 DIAGNOSIS — K0889 Other specified disorders of teeth and supporting structures: Secondary | ICD-10-CM | POA: Insufficient documentation

## 2021-08-15 MED ORDER — AMOXICILLIN 500 MG PO CAPS: 500.0000 mg | ORAL_CAPSULE | Freq: Three times a day (TID) | ORAL | 0 refills | Status: DC

## 2021-08-15 NOTE — ED Triage Notes (Signed)
Pt c/o dental pain since this morning.PT reports she is unable to get to the dentist because she doesn't have anyone to take her

## 2021-08-15 NOTE — ED Provider Notes (Signed)
[facility] EMERGENCY DEPARTMENT Provider Note   CSN: [id number] Arrival date & time: 08/15/21  2037     History  Chief Complaint  Patient presents with   Dental Pain    Robin Petersen is a 31 y.o. female.  Pt complains of dental pain for 1 day  The history is provided by the patient. No language interpreter was used.  Dental Pain Location:  Upper Quality:  Aching Severity:  Moderate Onset quality:  Sudden Duration:  1 day Timing:  Constant Progression:  Worsening Relieved by:  Nothing Worsened by:  Nothing Associated symptoms: no facial swelling and no fever       Home Medications Prior to Admission medications   Medication Sig Start Date End Date Taking? Authorizing Provider  acetaminophen (TYLENOL) 500 MG tablet Take 1 tablet (500 mg total) by mouth every 6 (six) hours as needed. 10/18/19   Fawze, Mina A, PA-C  cephALEXin (KEFLEX) 500 MG capsule Take 1 capsule (500 mg total) by mouth 3 (three) times daily. 06/04/20   Carlisle Cater, PA-C  cyclobenzaprine (FLEXERIL) 10 MG tablet Take 1 tablet (10 mg total) by mouth 2 (two) times daily as needed for muscle spasms. 10/18/19   Fawze, Mina A, PA-C  ibuprofen (ADVIL) 600 MG tablet Take 1 tablet (600 mg total) by mouth every 6 (six) hours as needed for mild pain or moderate pain. 0000000   Lianne Cure, DO  Misc. Devices ([employer] CRUTCHES) MISC 1 each by Does not apply route daily. 0000000   Campbell Stall P, DO  naproxen (NAPROSYN) 500 MG tablet Take 1 tablet (500 mg total) by mouth 2 (two) times daily. 06/04/20   Carlisle Cater, PA-C  ondansetron (ZOFRAN) 4 MG tablet Take 1 tablet (4 mg total) by mouth every 6 (six) hours. 12/20/18   Alveria Apley, PA-C  predniSONE (DELTASONE) 20 MG tablet Take 2 tablets (40 mg total) by mouth daily. 05/10/21   Davonna Belling, MD      Allergies    Patient has no known allergies.    Review of Systems   Review of Systems  Constitutional:  Negative for fever.  HENT:   Negative for facial swelling.   All other systems reviewed and are negative.  Physical Exam Updated Vital Signs BP 106/75   Pulse 96   Temp 98.8 F (37.1 C) (Oral)   Resp 18   SpO2 99%  Physical Exam Vitals and nursing note reviewed.  Constitutional:      Appearance: She is well-developed.  HENT:     Head: Normocephalic.     Mouth/Throat:     Comments: No abscess,  Cardiovascular:     Rate and Rhythm: Normal rate.  Pulmonary:     Effort: Pulmonary effort is normal.  Abdominal:     General: There is no distension.  Musculoskeletal:        General: Normal range of motion.     Cervical back: Normal range of motion.  Skin:    General: Skin is warm.  Neurological:     Mental Status: She is alert and oriented to person, place, and time.  Psychiatric:        Mood and Affect: Mood normal.    ED Results / Procedures / Treatments   Labs (all labs ordered are listed, but only abnormal results are displayed) Labs Reviewed - No data to display  EKG None  Radiology No results found.  Procedures Procedures    Medications Ordered in ED Medications -  No data to display  ED Course/ Medical Decision Making/ A&P                           Medical Decision Making  MDM:  Pt advised to follow up with dentist.  Rx for amoxicillin.         Final Clinical Impression(s) / ED Diagnoses Final diagnoses:  Toothache    Rx / DC Orders ED Discharge Orders          Ordered    amoxicillin (AMOXIL) 500 MG capsule  3 times daily        08/15/21 2124          An After Visit Summary was printed and given to the patient.     Sidney Ace 08/15/21 2125    Tegeler, Gwenyth Allegra, MD 08/15/21 2145

## 2021-10-23 ENCOUNTER — Encounter (HOSPITAL_BASED_OUTPATIENT_CLINIC_OR_DEPARTMENT_OTHER): Payer: Self-pay

## 2021-10-23 ENCOUNTER — Emergency Department (HOSPITAL_BASED_OUTPATIENT_CLINIC_OR_DEPARTMENT_OTHER): Payer: Self-pay

## 2021-10-23 ENCOUNTER — Other Ambulatory Visit: Payer: Self-pay

## 2021-10-23 ENCOUNTER — Emergency Department (HOSPITAL_BASED_OUTPATIENT_CLINIC_OR_DEPARTMENT_OTHER)
Admission: EM | Admit: 2021-10-23 | Discharge: 2021-10-23 | Disposition: A | Discharge status: Home / Self Care | Payer: Self-pay | Attending: Emergency Medicine | Admitting: Emergency Medicine

## 2021-10-23 DIAGNOSIS — X500XXA Overexertion from strenuous movement or load, initial encounter: Secondary | ICD-10-CM | POA: Insufficient documentation

## 2021-10-23 DIAGNOSIS — S86912A Strain of unspecified muscle(s) and tendon(s) at lower leg level, left leg, initial encounter: Secondary | ICD-10-CM | POA: Insufficient documentation

## 2021-10-23 MED ORDER — IBUPROFEN 800 MG PO TABS
800.0000 mg | ORAL_TABLET | Freq: Once | ORAL | Status: AC
Start: 2021-10-23 — End: 2021-10-23
  Administered 2021-10-23: 800 mg via ORAL
  Filled 2021-10-23: qty 1

## 2021-10-23 NOTE — Discharge Instructions (Signed)
Please use cold therapy, ibuprofen, crutches as needed for pain Follow-up with your primary care physician if you continue to have pain over the next week

## 2021-10-23 NOTE — ED Provider Notes (Signed)
[facility] EMERGENCY DEPT Provider Note   CSN: [id number] Arrival date & time: 10/23/21  1658     History {Add pertinent medical, surgical, social history, OB history to HPI:1} Chief Complaint  Patient presents with  . Leg Pain    Robin Petersen is a 31 y.o. female.  HPI 31 year old female presents today complaining of pain in the back of her leg.  States that she was lifting a heavy jack today.  She had a pulling sensation has had pain in the area since that time.  Pain is on the left side.  She does not have any pain in her back.  She is not having numbness, tingling, weakness, loss of bowel or bladder control.     Home Medications Prior to Admission medications   Medication Sig Start Date End Date Taking? Authorizing Provider  acetaminophen (TYLENOL) 500 MG tablet Take 1 tablet (500 mg total) by mouth every 6 (six) hours as needed. 10/18/19   Fawze, Mina A, PA-C  amoxicillin (AMOXIL) 500 MG capsule Take 1 capsule (500 mg total) by mouth 3 (three) times daily. 08/15/21   Elson Areas, PA-C  cephALEXin (KEFLEX) 500 MG capsule Take 1 capsule (500 mg total) by mouth 3 (three) times daily. 06/04/20   Renne Crigler, PA-C  cyclobenzaprine (FLEXERIL) 10 MG tablet Take 1 tablet (10 mg total) by mouth 2 (two) times daily as needed for muscle spasms. 10/18/19   Fawze, Mina A, PA-C  ibuprofen (ADVIL) 600 MG tablet Take 1 tablet (600 mg total) by mouth every 6 (six) hours as needed for mild pain or moderate pain. 06/04/21   Franne Forts, DO  Misc. Devices ([employer] CRUTCHES) MISC 1 each by Does not apply route daily. 06/04/21   Edwin Dada P, DO  naproxen (NAPROSYN) 500 MG tablet Take 1 tablet (500 mg total) by mouth 2 (two) times daily. 06/04/20   Renne Crigler, PA-C  ondansetron (ZOFRAN) 4 MG tablet Take 1 tablet (4 mg total) by mouth every 6 (six) hours. 12/20/18   Arlyn Dunning, PA-C  predniSONE (DELTASONE) 20 MG tablet Take 2 tablets (40 mg total) by mouth daily. 05/10/21    Benjiman Core, MD      Allergies    Patient has no known allergies.    Review of Systems   Review of Systems  Physical Exam Updated Vital Signs BP 105/80 (BP Location: Right Arm)   Pulse (!) 127   Temp 99.4 F (37.4 C)   Resp 16   Ht 1.676 m (5\' 6" )   Wt 122.5 kg   SpO2 99%   BMI 43.59 kg/m  Physical Exam Vitals and nursing note reviewed.  Constitutional:      General: She is not in acute distress.    Appearance: She is well-developed.  HENT:     Head: Normocephalic and atraumatic.     Right Ear: External ear normal.     Left Ear: External ear normal.     Nose: Nose normal.  Eyes:     Conjunctiva/sclera: Conjunctivae normal.     Pupils: Pupils are equal, round, and reactive to light.  Pulmonary:     Effort: Pulmonary effort is normal.  Musculoskeletal:        General: Tenderness present. Normal range of motion.     Cervical back: Normal range of motion and neck supple.     Comments: Mild tenderness posterior left upper leg.  Skin:    General: Skin is warm and dry.  Neurological:  Mental Status: She is alert and oriented to person, place, and time.     Motor: No abnormal muscle tone.     Coordination: Coordination normal.  Psychiatric:        Behavior: Behavior normal.        Thought Content: Thought content normal.    ED Results / Procedures / Treatments   Labs (all labs ordered are listed, but only abnormal results are displayed) Labs Reviewed - No data to display  EKG None  Radiology DG Femur Min 2 Views Left  Result Date: 10/23/2021 CLINICAL DATA:  Left leg pain EXAM: LEFT FEMUR 2 VIEWS COMPARISON:  None Available. FINDINGS: There is no evidence of fracture or other focal bone lesions. Soft tissues are unremarkable. IMPRESSION: Negative. Electronically Signed   By: Minerva Fester M.D.   On: 10/23/2021 18:24    Procedures Procedures  {Document cardiac monitor, telemetry assessment procedure when appropriate:1}  Medications Ordered in  ED Medications  ibuprofen (ADVIL) tablet 800 mg (800 mg Oral Given 10/23/21 1802)    ED Course/ Medical Decision Making/ A&P                           Medical Decision Making Amount and/or Complexity of Data Reviewed Radiology: ordered.  Risk Prescription drug management.     {Document critical care time when appropriate:1} {Document review of labs and clinical decision tools ie heart score, Chads2Vasc2 etc:1}  {Document your independent review of radiology images, and any outside records:1} {Document your discussion with family members, caretakers, and with consultants:1} {Document social determinants of health affecting pt's care:1} {Document your decision making why or why not admission, treatments were needed:1} Final Clinical Impression(s) / ED Diagnoses Final diagnoses:  None    Rx / DC Orders ED Discharge Orders     None

## 2021-10-23 NOTE — ED Triage Notes (Signed)
Patient here POV from Home.  Endorses Left Leg Pain since 0600 that has worsened since Pain began. Pain is Posterior Upper Left Thigh and radiates downwards.   No Known Injury.   NAD Noted during Triage. A&Ox4. GCS 15. Ambulatory.

## 2021-10-27 ENCOUNTER — Emergency Department (HOSPITAL_COMMUNITY)
Admission: EM | Admit: 2021-10-27 | Discharge: 2021-10-27 | Discharge status: Against Medical Advice | Payer: Self-pay | Attending: Emergency Medicine | Admitting: Emergency Medicine

## 2021-10-27 ENCOUNTER — Emergency Department (HOSPITAL_COMMUNITY): Payer: Self-pay

## 2021-10-27 ENCOUNTER — Encounter (HOSPITAL_COMMUNITY): Payer: Self-pay | Admitting: Emergency Medicine

## 2021-10-27 DIAGNOSIS — Z5321 Procedure and treatment not carried out due to patient leaving prior to being seen by health care provider: Secondary | ICD-10-CM | POA: Insufficient documentation

## 2021-10-27 DIAGNOSIS — R079 Chest pain, unspecified: Secondary | ICD-10-CM | POA: Insufficient documentation

## 2021-10-27 DIAGNOSIS — F419 Anxiety disorder, unspecified: Secondary | ICD-10-CM | POA: Insufficient documentation

## 2021-10-27 LAB — CBC
HCT: 42.7 % (ref 36.0–46.0)
Hemoglobin: 13.8 g/dL (ref 12.0–15.0)
MCH: 29.8 pg (ref 26.0–34.0)
MCHC: 32.3 g/dL (ref 30.0–36.0)
MCV: 92.2 fL (ref 80.0–100.0)
Platelets: 215 10*3/uL (ref 150–400)
RBC: 4.63 MIL/uL (ref 3.87–5.11)
RDW: 13.2 % (ref 11.5–15.5)
WBC: 8.3 10*3/uL (ref 4.0–10.5)
nRBC: 0 % (ref 0.0–0.2)

## 2021-10-27 LAB — BASIC METABOLIC PANEL
Anion gap: 5 (ref 5–15)
BUN: 8 mg/dL (ref 6–20)
CO2: 25 mmol/L (ref 22–32)
Calcium: 8.4 mg/dL — ABNORMAL LOW (ref 8.9–10.3)
Chloride: 111 mmol/L (ref 98–111)
Creatinine, Ser: 0.84 mg/dL (ref 0.44–1.00)
GFR, Estimated: >60 mL/min (ref >60)
Glucose, Bld: 106 mg/dL — ABNORMAL HIGH (ref 70–99)
Potassium: 4.1 mmol/L (ref 3.5–5.1)
Sodium: 141 mmol/L (ref 135–145)

## 2021-10-27 LAB — I-STAT BETA HCG BLOOD, ED (MC, WL, AP ONLY): I-stat hCG, quantitative: <5 m[IU]/mL (ref <5)

## 2021-10-27 LAB — TROPONIN I (HIGH SENSITIVITY)
Troponin I (High Sensitivity): <2 ng/L (ref <18)
Troponin I (High Sensitivity): 4 ng/L (ref <18)
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
## 2021-10-27 NOTE — ED Notes (Signed)
Pt left stated she couldn't wait any longer she had to go.

## 2021-11-01 ENCOUNTER — Emergency Department (HOSPITAL_BASED_OUTPATIENT_CLINIC_OR_DEPARTMENT_OTHER)
Admission: EM | Admit: 2021-11-01 | Discharge: 2021-11-02 | Disposition: A | Discharge status: Home / Self Care | Payer: Self-pay | Attending: Emergency Medicine | Admitting: Emergency Medicine

## 2021-11-01 ENCOUNTER — Other Ambulatory Visit: Payer: Self-pay

## 2021-11-01 ENCOUNTER — Encounter (HOSPITAL_BASED_OUTPATIENT_CLINIC_OR_DEPARTMENT_OTHER): Payer: Self-pay

## 2021-11-01 DIAGNOSIS — E875 Hyperkalemia: Secondary | ICD-10-CM | POA: Insufficient documentation

## 2021-11-01 DIAGNOSIS — K760 Fatty (change of) liver, not elsewhere classified: Secondary | ICD-10-CM | POA: Insufficient documentation

## 2021-11-01 DIAGNOSIS — F17210 Nicotine dependence, cigarettes, uncomplicated: Secondary | ICD-10-CM | POA: Insufficient documentation

## 2021-11-01 DIAGNOSIS — R1011 Right upper quadrant pain: Secondary | ICD-10-CM | POA: Insufficient documentation

## 2021-11-01 DIAGNOSIS — E119 Type 2 diabetes mellitus without complications: Secondary | ICD-10-CM | POA: Insufficient documentation

## 2021-11-01 NOTE — ED Triage Notes (Signed)
Pt BIB EMS from N/V/D since this AM. Pt also c/o RUQ pain.

## 2021-11-02 ENCOUNTER — Emergency Department (HOSPITAL_BASED_OUTPATIENT_CLINIC_OR_DEPARTMENT_OTHER): Payer: Self-pay

## 2021-11-02 LAB — CBC
HCT: 36.7 % (ref 36.0–46.0)
Hemoglobin: 12.1 g/dL (ref 12.0–15.0)
MCH: 30.3 pg (ref 26.0–34.0)
MCHC: 33 g/dL (ref 30.0–36.0)
MCV: 92 fL (ref 80.0–100.0)
Platelets: 204 10*3/uL (ref 150–400)
RBC: 3.99 MIL/uL (ref 3.87–5.11)
RDW: 13.4 % (ref 11.5–15.5)
WBC: 10.2 10*3/uL (ref 4.0–10.5)
nRBC: 0 % (ref 0.0–0.2)

## 2021-11-02 LAB — LIPASE, BLOOD: Lipase: 30 U/L (ref 11–51)

## 2021-11-02 LAB — COMPREHENSIVE METABOLIC PANEL
ALT: 9 U/L (ref 0–44)
AST: 20 U/L (ref 15–41)
Albumin: 3.6 g/dL (ref 3.5–5.0)
Alkaline Phosphatase: 65 U/L (ref 38–126)
Anion gap: 6 (ref 5–15)
BUN: 8 mg/dL (ref 6–20)
CO2: 26 mmol/L (ref 22–32)
Calcium: 7.8 mg/dL — ABNORMAL LOW (ref 8.9–10.3)
Chloride: 107 mmol/L (ref 98–111)
Creatinine, Ser: 0.83 mg/dL (ref 0.44–1.00)
GFR, Estimated: >60 mL/min (ref >60)
Glucose, Bld: 93 mg/dL (ref 70–99)
Potassium: 6 mmol/L — ABNORMAL HIGH (ref 3.5–5.1)
Sodium: 139 mmol/L (ref 135–145)
Total Bilirubin: 0.4 mg/dL (ref 0.3–1.2)
Total Protein: 6.6 g/dL (ref 6.5–8.1)

## 2021-11-02 MED ORDER — SODIUM CHLORIDE 0.9 % IV BOLUS
1000.0000 mL | Freq: Once | INTRAVENOUS | Status: AC
  Administered 2021-11-02: 1000 mL via INTRAVENOUS

## 2021-11-02 MED ORDER — KETOROLAC TROMETHAMINE 15 MG/ML IJ SOLN
15.0000 mg | Freq: Once | INTRAMUSCULAR | Status: AC
  Administered 2021-11-02: 15 mg via INTRAVENOUS
  Filled 2021-11-02: qty 1

## 2021-11-02 MED ORDER — ONDANSETRON HCL 4 MG/2ML IJ SOLN
4.0000 mg | Freq: Once | INTRAMUSCULAR | Status: AC
  Administered 2021-11-02: 4 mg via INTRAVENOUS
  Filled 2021-11-02: qty 2

## 2021-11-02 MED ORDER — ONDANSETRON 4 MG PO TBDP: 4.0000 mg | ORAL_TABLET | Freq: Three times a day (TID) | ORAL | 0 refills | Status: DC | PRN

## 2021-11-02 MED ORDER — SODIUM ZIRCONIUM CYCLOSILICATE 10 G PO PACK
10.0000 g | PACK | Freq: Once | ORAL | Status: AC
  Administered 2021-11-02: 10 g via ORAL
  Filled 2021-11-02: qty 1

## 2021-11-02 NOTE — Discharge Instructions (Signed)
You were evaluated in the Emergency Department and after careful evaluation, we did not find any emergent condition requiring admission or further testing in the hospital.  Your exam/testing today was overall reassuring.  Symptoms may be due to fatty liver disease.  Recommend follow-up with your primary care doctor and/or gastroenterologist.  Recommend weight loss as we discussed.  Please return to the Emergency Department if you experience any worsening of your condition.  Thank you for allowing [place] to be a part of your care.

## 2021-11-02 NOTE — ED Notes (Signed)
Pt verbalizes understanding of discharge instructions. Opportunity for questioning and answers were provided. Pt discharged from ED to home with family.    

## 2021-11-02 NOTE — ED Provider Notes (Signed)
[facility] EMERGENCY [facility] Emergency Department Provider Note MRN:  [id number]  Arrival date & time: 11/02/21     Chief Complaint   Abdominal Pain   History of Present Illness   Robin Petersen is a 31 y.o. year-old female with a history of diabetes presenting to the ED with chief complaint of abdominal pain.  Right upper quadrant abdominal pain all day today, associated with nausea, vomiting, diarrhea.  Denies fever, no lower abdominal pain, no burning with urination, no vaginal bleeding or discharge.  Has happened before.  Review of Systems  A thorough review of systems was obtained and all systems are negative except as noted in the HPI and PMH.   Patient's Health History    Past Medical History:  Diagnosis Date   Diabetes mellitus without complication (HCC)    Hypotension     Past Surgical History:  Procedure Laterality Date   CESAREAN SECTION      History reviewed. No pertinent family history.  Social History   Socioeconomic History   Marital status: Single    Spouse name: Not on file   Number of children: Not on file   Years of education: Not on file   Highest education level: Not on file  Occupational History   Not on file  Tobacco Use   Smoking status: Every Day    Packs/day: 0.25    Years: 9.00    Total pack years: 2.25    Types: Cigarettes   Smokeless tobacco: Never  Vaping Use   Vaping Use: Never used  Substance and Sexual Activity   Alcohol use: Never   Drug use: Not Currently    Types: Marijuana   Sexual activity: Not on file  Other Topics Concern   Not on file  Social History Narrative   Not on file   Social Determinants of Health   Financial Resource Strain: Not on file  Food Insecurity: Not on file  Transportation Needs: Not on file  Physical Activity: Not on file  Stress: Not on file  Social Connections: Not on file  Intimate Partner Violence: Not on file     Physical Exam   Vitals:   11/02/21 0100 11/02/21 0200   BP: 105/67 107/68  Pulse: 79 76  Resp: 18 18  Temp:    SpO2: 100% 99%    CONSTITUTIONAL: Well-appearing, NAD NEURO/PSYCH:  Alert and oriented x 3, no focal deficits EYES:  eyes equal and reactive ENT/NECK:  no LAD, no JVD CARDIO: Regular rate, well-perfused, normal S1 and S2 PULM:  CTAB no wheezing or rhonchi GI/GU:  non-distended, non-tender MSK/SPINE:  No gross deformities, no edema SKIN:  no rash, atraumatic   *Additional and/or pertinent findings included in MDM below  Diagnostic and Interventional Summary    EKG Interpretation  Date/Time:  Wednesday November 02 2021 02:35:37 EDT Ventricular Rate:  81 PR Interval:  149 QRS Duration: 83 QT Interval:  372 QTC Calculation: 432 R Axis:   18 Text Interpretation: Sinus rhythm Nonspecific T abnormalities, anterior leads Confirmed by Kennis Carina [phone]) on 11/02/2021 2:42:07 AM       Labs Reviewed  COMPREHENSIVE METABOLIC PANEL - Abnormal; Notable for the following components:      Result Value   Potassium 6.0 (*)    Calcium 7.8 (*)    All other components within normal limits  CBC  LIPASE, BLOOD    US Abdomen Limited RUQ (LIVER/GB)  Final Result      Medications  ondansetron (ZOFRAN) injection 4  mg (4 mg Intravenous Given 11/02/21 0102)  ketorolac (TORADOL) 15 MG/ML injection 15 mg (15 mg Intravenous Given 11/02/21 0102)  sodium chloride 0.9 % bolus 1,000 mL (0 mLs Intravenous Stopped 11/02/21 0231)  sodium zirconium cyclosilicate (LOKELMA) packet 10 g (10 g Oral Given 11/02/21 0230)     Procedures  /  Critical Care Procedures  ED Course and Medical Decision Making  Initial Impression and Ddx Right upper quadrant pain, differential diagnosis includes biliary colic, cholecystitis, gastritis, peptic ulcer disease, pancreatitis.  Per chart review patient has a history of fatty liver disease, had a fairly thorough evaluation of her gallbladder a year ago with HIDA scan that was normal.  Providing symptom control,  obtaining labs, ultrasound.  Anticipating discharge if we can get her feeling better and work-up is reassuring.  Past medical/surgical history that increases complexity of ED encounter: None  Interpretation of Diagnostics I personally reviewed the laboratory assessment and my interpretation is as follows: No significant blood count disturbance.  Hyperkalemia at 6.0 noted, suspicious for hemolysis given the normal kidney function and no real reason for this value to be elevated.  Discussed with nursing, the blood did draw very slowly and so hemolysis is suspected.  There is no concerning EKG findings.    Patient Reassessment and Ultimate Disposition/Management     Patient feeling much better, appropriate for discharge.  Patient management required discussion with the following services or consulting groups:  None  Complexity of Problems Addressed Acute illness or injury that poses threat of life of bodily function  Additional Data Reviewed and Analyzed Further history obtained from: Prior labs/imaging results  Additional Factors Impacting ED Encounter Risk Prescriptions  Elmer Sow. Pilar Plate, MD [facility] Emergency Medicine [facility] [email or web address]  Final Clinical Impressions(s) / ED Diagnoses     ICD-10-CM   1. RUQ abdominal pain  R10.11     2. Fatty liver disease, nonalcoholic  K76.0       ED Discharge Orders          Ordered    ondansetron (ZOFRAN-ODT) 4 MG disintegrating tablet  Every 8 hours PRN        11/02/21 0244             Discharge Instructions Discussed with and Provided to Patient:     Discharge Instructions      You were evaluated in the Emergency Department and after careful evaluation, we did not find any emergent condition requiring admission or further testing in the hospital.  Your exam/testing today was overall reassuring.  Symptoms may be due to fatty liver disease.  Recommend follow-up with your primary care doctor  and/or gastroenterologist.  Recommend weight loss as we discussed.  Please return to the Emergency Department if you experience any worsening of your condition.  Thank you for allowing [place] to be a part of your care.        Sabas Sous, MD 11/02/21 [phone]

## 2021-11-17 ENCOUNTER — Emergency Department (HOSPITAL_COMMUNITY): Payer: Self-pay

## 2021-11-17 ENCOUNTER — Emergency Department (HOSPITAL_COMMUNITY)
Admission: EM | Admit: 2021-11-17 | Discharge: 2021-11-17 | Disposition: A | Discharge status: Home / Self Care | Payer: Self-pay | Attending: Student | Admitting: Student

## 2021-11-17 DIAGNOSIS — R1011 Right upper quadrant pain: Secondary | ICD-10-CM | POA: Insufficient documentation

## 2021-11-17 DIAGNOSIS — Z5321 Procedure and treatment not carried out due to patient leaving prior to being seen by health care provider: Secondary | ICD-10-CM | POA: Insufficient documentation

## 2021-11-17 LAB — URINALYSIS, ROUTINE W REFLEX MICROSCOPIC
Bilirubin Urine: NEGATIVE
Glucose, UA: NEGATIVE mg/dL
Ketones, ur: NEGATIVE mg/dL
Nitrite: NEGATIVE
Protein, ur: 30 mg/dL — AB
RBC / HPF: >50 RBC/hpf — ABNORMAL HIGH (ref 0–5)
Specific Gravity, Urine: 1.015 (ref 1.005–1.030)
WBC, UA: >50 WBC/hpf — ABNORMAL HIGH (ref 0–5)
pH: 6 (ref 5.0–8.0)

## 2021-11-17 LAB — CBC WITH DIFFERENTIAL/PLATELET
Abs Immature Granulocytes: 0.03 10*3/uL (ref 0.00–0.07)
Basophils Absolute: 0.1 10*3/uL (ref 0.0–0.1)
Basophils Relative: 1 %
Eosinophils Absolute: 0.1 10*3/uL (ref 0.0–0.5)
Eosinophils Relative: 1 %
HCT: 45.1 % (ref 36.0–46.0)
Hemoglobin: 14.2 g/dL (ref 12.0–15.0)
Immature Granulocytes: 0 %
Lymphocytes Relative: 23 %
Lymphs Abs: 2.3 10*3/uL (ref 0.7–4.0)
MCH: 29.8 pg (ref 26.0–34.0)
MCHC: 31.5 g/dL (ref 30.0–36.0)
MCV: 94.5 fL (ref 80.0–100.0)
Monocytes Absolute: 0.7 10*3/uL (ref 0.1–1.0)
Monocytes Relative: 7 %
Neutro Abs: 7 10*3/uL (ref 1.7–7.7)
Neutrophils Relative %: 68 %
Platelets: 240 10*3/uL (ref 150–400)
RBC: 4.77 MIL/uL (ref 3.87–5.11)
RDW: 13.2 % (ref 11.5–15.5)
WBC: 10.2 10*3/uL (ref 4.0–10.5)
nRBC: 0 % (ref 0.0–0.2)

## 2021-11-17 LAB — I-STAT BETA HCG BLOOD, ED (MC, WL, AP ONLY): I-stat hCG, quantitative: 7.7 m[IU]/mL — ABNORMAL HIGH (ref <5)

## 2021-11-17 LAB — COMPREHENSIVE METABOLIC PANEL
ALT: 12 U/L (ref 0–44)
AST: 13 U/L — ABNORMAL LOW (ref 15–41)
Albumin: 3.7 g/dL (ref 3.5–5.0)
Alkaline Phosphatase: 74 U/L (ref 38–126)
Anion gap: 10 (ref 5–15)
BUN: 7 mg/dL (ref 6–20)
CO2: 22 mmol/L (ref 22–32)
Calcium: 8.7 mg/dL — ABNORMAL LOW (ref 8.9–10.3)
Chloride: 107 mmol/L (ref 98–111)
Creatinine, Ser: 0.87 mg/dL (ref 0.44–1.00)
GFR, Estimated: >60 mL/min (ref >60)
Glucose, Bld: 91 mg/dL (ref 70–99)
Potassium: 4.6 mmol/L (ref 3.5–5.1)
Sodium: 139 mmol/L (ref 135–145)
Total Bilirubin: 0.7 mg/dL (ref 0.3–1.2)
Total Protein: 7.2 g/dL (ref 6.5–8.1)

## 2021-11-17 LAB — LIPASE, BLOOD: Lipase: 26 U/L (ref 11–51)

## 2021-11-17 NOTE — ED Provider Triage Note (Signed)
Emergency Medicine Provider Triage Evaluation Note  Robin Petersen , a 31 y.o. female  was evaluated in triage.  Pt complains of right upper quadrant abdominal pain.  Is been going on for some number of weeks but worsened yesterday.  No nausea or vomiting, history of a C-section but no other abdominal surgeries..  Review of Systems  Per HPI  Physical Exam  BP 110/81 (BP Location: Right Arm)   Pulse (!) 105   Temp 99.4 F (37.4 C) (Oral)   Resp 18   LMP 10/11/2021 (Exact Date)   SpO2 97%  Gen:   Awake, no distress   Resp:  Normal effort  MSK:   Moves extremities without difficulty  Other:  Right upper quadrant tenderness  Medical Decision Making  Medically screening exam initiated at 6:51 PM.  Appropriate orders placed.  Robin Petersen was informed that the remainder of the evaluation will be completed by another provider, this initial triage assessment does not replace that evaluation, and the importance of remaining in the ED until their evaluation is complete.     Theron Arista, PA-C 11/17/21 1851

## 2021-11-17 NOTE — ED Triage Notes (Signed)
Pt here for RUQ abd pain for weeks but has worsened since yesterday. Pt denies N/V.

## 2021-11-17 NOTE — ED Notes (Signed)
Pt left AMA °

## 2021-11-18 ENCOUNTER — Emergency Department (HOSPITAL_COMMUNITY)
Admission: EM | Admit: 2021-11-18 | Discharge: 2021-11-18 | Disposition: A | Discharge status: Home / Self Care | Payer: Self-pay | Attending: Emergency Medicine | Admitting: Emergency Medicine

## 2021-11-18 ENCOUNTER — Other Ambulatory Visit: Payer: Self-pay

## 2021-11-18 DIAGNOSIS — R1011 Right upper quadrant pain: Secondary | ICD-10-CM | POA: Insufficient documentation

## 2021-11-18 DIAGNOSIS — R109 Unspecified abdominal pain: Secondary | ICD-10-CM

## 2021-11-18 NOTE — Discharge Instructions (Signed)
You have recurrent right upper quadrant abdominal pain that will need assessment by your primary care doctor.  You have had normal ultrasound, normal HIDA scan.  Your blood work including your liver enzymes is reassuring.  We suspect that you might need GI doctor evaluation if your symptoms continue.  Please follow-up with your primary care doctor.

## 2021-11-18 NOTE — ED Provider Notes (Signed)
[facility] EMERGENCY DEPARTMENT Provider Note   CSN: [id number] Arrival date & time: 11/18/21  4098     History  Chief Complaint  Patient presents with   Abdominal Pain    Robin Petersen is a 31 y.o. female.  HPI     31 year old female comes in with chief complaint of recurrent right upper quadrant abdominal pain.  Patient states that she started having abdominal pain yesterday.  It is located in the right upper quadrant.  There is no specific trigger.  She has been having this pain now for several months.  Typically the pain comes every week, unprovoked and it will last anywhere from few minutes to several hours.  Patient has followed up with surgery, HIDA scan was ordered and it was negative.  She has not seen GI doctors for this.  She was told that she has fatty liver.  Patient denies any back pain.  No burning with urination, blood in the urine, vaginal discharge or bleeding.  Home Medications Prior to Admission medications   Medication Sig Start Date End Date Taking? Authorizing Provider  acetaminophen (TYLENOL) 500 MG tablet Take 1 tablet (500 mg total) by mouth every 6 (six) hours as needed. 10/18/19   Fawze, Mina A, PA-C  amoxicillin (AMOXIL) 500 MG capsule Take 1 capsule (500 mg total) by mouth 3 (three) times daily. 08/15/21   Elson Areas, PA-C  cephALEXin (KEFLEX) 500 MG capsule Take 1 capsule (500 mg total) by mouth 3 (three) times daily. 06/04/20   Renne Crigler, PA-C  cyclobenzaprine (FLEXERIL) 10 MG tablet Take 1 tablet (10 mg total) by mouth 2 (two) times daily as needed for muscle spasms. 10/18/19   Fawze, Mina A, PA-C  ibuprofen (ADVIL) 600 MG tablet Take 1 tablet (600 mg total) by mouth every 6 (six) hours as needed for mild pain or moderate pain. 06/04/21   Franne Forts, DO  Misc. Devices ([employer] CRUTCHES) MISC 1 each by Does not apply route daily. 06/04/21   Edwin Dada P, DO  naproxen (NAPROSYN) 500 MG tablet Take 1 tablet (500 mg total)  by mouth 2 (two) times daily. 06/04/20   Renne Crigler, PA-C  ondansetron (ZOFRAN) 4 MG tablet Take 1 tablet (4 mg total) by mouth every 6 (six) hours. 12/20/18   Ronnie Doss A, PA-C  ondansetron (ZOFRAN-ODT) 4 MG disintegrating tablet Take 1 tablet (4 mg total) by mouth every 8 (eight) hours as needed for nausea or vomiting. 11/02/21   Sabas Sous, MD  predniSONE (DELTASONE) 20 MG tablet Take 2 tablets (40 mg total) by mouth daily. 05/10/21   Benjiman Core, MD      Allergies    Patient has no known allergies.    Review of Systems   Review of Systems  All other systems reviewed and are negative.   Physical Exam Updated Vital Signs BP 100/80 (BP Location: Right Arm)   Pulse 90   Temp 98.6 F (37 C) (Oral)   Resp 17   LMP 10/11/2021 (Exact Date)   SpO2 99%  Physical Exam Vitals and nursing note reviewed.  Constitutional:      Appearance: She is well-developed.  HENT:     Head: Atraumatic.  Cardiovascular:     Rate and Rhythm: Normal rate.  Pulmonary:     Effort: Pulmonary effort is normal.  Abdominal:     Tenderness: There is abdominal tenderness in the right upper quadrant. There is no guarding or rebound. Negative signs include  Murphy's sign.  Musculoskeletal:     Cervical back: Normal range of motion and neck supple.  Skin:    General: Skin is warm and dry.  Neurological:     Mental Status: She is alert and oriented to person, place, and time.     ED Results / Procedures / Treatments   Labs (all labs ordered are listed, but only abnormal results are displayed) Labs Reviewed - No data to display  EKG None  Radiology US Abdomen Limited RUQ (LIVER/GB)  Result Date: 11/17/2021 CLINICAL DATA:  Right upper quadrant pain EXAM: ULTRASOUND ABDOMEN LIMITED RIGHT UPPER QUADRANT COMPARISON:  Ultrasound abdomen November 02, 2021 FINDINGS: Gallbladder: No gallstones or wall thickening visualized. No sonographic Murphy sign noted by sonographer. Common bile duct:  Diameter: 3.3 mm Liver: Increased echogenicity. No focal lesion. Portal vein is patent on color Doppler imaging with normal direction of blood flow towards the liver. Other: None. IMPRESSION: Increased hepatic parenchymal echogenicity suggestive of steatosis. No cholelithiasis or sonographic evidence for acute cholecystitis. Electronically Signed   By: Annia Belt M.D.   On: 11/17/2021 19:44    Procedures Procedures    Medications Ordered in ED Medications - No data to display  ED Course/ Medical Decision Making/ A&P                           Medical Decision Making  This patient presents to the ED with chief complaint(s) of abdominal pain with pertinent past medical history of negative ultrasound and negative HIDA scan in the past for same pain.  The differential diagnosis for right upper quadrant pain would include cholelithiasis, biliary dyskinesia, but both of those conditions have been ruled out.  She has fatty liver, but that is not typically associated with severe pain.  Gastritis, peptic ulcer disease considered, but this is not typical presentation for it.  Endometriosis, IBS etc. are also possible.  Additional history obtained: Records reviewed  ultrasound that was completed yesterday along with labs that were completed yesterday.  They were all reassuring.  Independent labs interpretation:  CBC, CMP and lipase were reassuring  Independent visualization and interpretation of imaging: - I independently visualized the following imaging with scope of interpretation limited to determining acute life threatening conditions related to emergency care: Ultrasound right upper quadrant, which revealed no evidence of cholecystitis  Treatment and Reassessment: Results of the ER work-up discussed with the patient.  Advised her to follow-up with her PCP, she might need a GI consultation.    Final Clinical Impression(s) / ED Diagnoses Final diagnoses:  Recurrent abdominal pain     Rx / DC Orders ED Discharge Orders     None         Derwood Kaplan, MD 11/18/21 1156

## 2021-11-18 NOTE — ED Triage Notes (Addendum)
EMS stated, she is having rt. Upper abdominal pain , started yesterday. Its been ongoing. Had a [place] yesterday ? Results. Pt. Was here yesterday for the same symptoms.

## 2021-12-03 ENCOUNTER — Other Ambulatory Visit: Payer: Self-pay

## 2021-12-03 ENCOUNTER — Emergency Department (HOSPITAL_COMMUNITY)
Admission: EM | Admit: 2021-12-03 | Discharge: 2021-12-03 | Disposition: A | Discharge status: Home / Self Care | Payer: Self-pay | Attending: Emergency Medicine | Admitting: Emergency Medicine

## 2021-12-03 ENCOUNTER — Encounter (HOSPITAL_COMMUNITY): Payer: Self-pay

## 2021-12-03 DIAGNOSIS — E119 Type 2 diabetes mellitus without complications: Secondary | ICD-10-CM | POA: Insufficient documentation

## 2021-12-03 DIAGNOSIS — M5432 Sciatica, left side: Secondary | ICD-10-CM | POA: Insufficient documentation

## 2021-12-03 DIAGNOSIS — F17210 Nicotine dependence, cigarettes, uncomplicated: Secondary | ICD-10-CM | POA: Insufficient documentation

## 2021-12-03 MED ORDER — METHOCARBAMOL 500 MG PO TABS
500.0000 mg | ORAL_TABLET | Freq: Three times a day (TID) | ORAL | 0 refills | Status: DC | PRN
Start: 2021-12-03 — End: 2023-04-22

## 2021-12-03 MED ORDER — LIDOCAINE 5 % EX PTCH: 1.0000 | MEDICATED_PATCH | CUTANEOUS | 0 refills | Status: DC

## 2021-12-03 NOTE — Discharge Instructions (Signed)
You were evaluated in the Emergency Department and after careful evaluation, we did not find any emergent condition requiring admission or further testing in the hospital.  Your exam/testing today is overall reassuring.  Symptoms seem to be due to sciatica.  Continue your meloxicam at home.  Also recommend the numbing patches daily, can use the Robaxin muscle relaxers for more significant pain.  Please return to the Emergency Department if you experience any worsening of your condition.   Thank you for allowing [place] to be a part of your care.

## 2021-12-03 NOTE — ED Provider Notes (Signed)
[facility] Emergency Department Provider Note MRN:  [id number]  Arrival date & time: 12/03/21     Chief Complaint   Sciatica   History of Present Illness   Robin Petersen is a 31 y.o. year-old female with a history of diabetes presenting to the ED with chief complaint of sciatica.  Pain to the left buttocks with radiation down the back of the left leg.  Denies numbness or weakness, no bowel or bladder dysfunction, no fever.  Thinks she rolled over during sleep and felt a pop in her back.  No trauma recently.  Review of Systems  A thorough review of systems was obtained and all systems are negative except as noted in the HPI and PMH.   Patient's Health History    Past Medical History:  Diagnosis Date   Diabetes mellitus without complication ([place])    Hypotension     Past Surgical History:  Procedure Laterality Date   CESAREAN SECTION      No family history on file.  Social History   Socioeconomic History   Marital status: Single    Spouse name: Not on file   Number of children: Not on file   Years of education: Not on file   Highest education level: Not on file  Occupational History   Not on file  Tobacco Use   Smoking status: Every Day    Packs/day: 0.25    Years: 9.00    Total pack years: 2.25    Types: Cigarettes   Smokeless tobacco: Never  Vaping Use   Vaping Use: Never used  Substance and Sexual Activity   Alcohol use: Never   Drug use: Not Currently    Types: Marijuana   Sexual activity: Not on file  Other Topics Concern   Not on file  Social History Narrative   Not on file   Social Determinants of Health   Financial Resource Strain: Not on file  Food Insecurity: Not on file  Transportation Needs: Not on file  Physical Activity: Not on file  Stress: Not on file  Social Connections: Not on file  Intimate Partner Violence: Not on file     Physical Exam   Vitals:   12/03/21 2045  BP: 109/68  Pulse: 86  Resp:  18  Temp: 98.6 F (37 C)  SpO2: 98%    CONSTITUTIONAL: Well-appearing, NAD NEURO/PSYCH:  Alert and oriented x 3, no focal deficits EYES:  eyes equal and reactive ENT/NECK:  no LAD, no JVD CARDIO: Regular rate, well-perfused, normal S1 and S2 PULM:  CTAB no wheezing or rhonchi GI/GU:  non-distended, non-tender MSK/SPINE:  No gross deformities, no edema SKIN:  no rash, atraumatic   *Additional and/or pertinent findings included in MDM below  Diagnostic and Interventional Summary    EKG Interpretation  Date/Time:    Ventricular Rate:    PR Interval:    QRS Duration:   QT Interval:    QTC Calculation:   R Axis:     Text Interpretation:         Labs Reviewed - No data to display  No orders to display    Medications - No data to display   Procedures  /  Critical Care Procedures  ED Course and Medical Decision Making  Initial Impression and Ddx Positive straight leg test, history and physical exam suspicious for sciatica.  No red flag symptoms to suggest myelopathy, no numbness or weakness, appropriate for discharge with pain regimen.  Past medical/surgical history  that increases complexity of ED encounter: None  Interpretation of Diagnostics Laboratory and/or imaging options to aid in the diagnosis/care of the patient were considered.  After careful history and physical examination, it was determined that there was no indication for diagnostics at this time.  Patient Reassessment and Ultimate Disposition/Management     Discharge  Patient management required discussion with the following services or consulting groups:  None  Complexity of Problems Addressed Acute complicated illness or Injury  Additional Data Reviewed and Analyzed Further history obtained from: None  Additional Factors Impacting ED Encounter Risk Prescriptions  Elmer Sow. Pilar Plate, MD [facility] Emergency Medicine [facility] [email or web address]  Final Clinical  Impressions(s) / ED Diagnoses     ICD-10-CM   1. Sciatica of left side  M54.32       ED Discharge Orders          Ordered    lidocaine (LIDODERM) 5 %  Every 24 hours        12/03/21 2326    methocarbamol (ROBAXIN) 500 MG tablet  Every 8 hours PRN        12/03/21 2326             Discharge Instructions Discussed with and Provided to Patient:    Discharge Instructions      You were evaluated in the Emergency Department and after careful evaluation, we did not find any emergent condition requiring admission or further testing in the hospital.  Your exam/testing today is overall reassuring.  Symptoms seem to be due to sciatica.  Continue your meloxicam at home.  Also recommend the numbing patches daily, can use the Robaxin muscle relaxers for more significant pain.  Please return to the Emergency Department if you experience any worsening of your condition.   Thank you for allowing [place] to be a part of your care.      Sabas Sous, MD 12/03/21 2328

## 2021-12-03 NOTE — ED Triage Notes (Signed)
Pt reports that she has pain that shoots down her L leg, denies injury.

## 2021-12-03 NOTE — ED Notes (Signed)
Patient verbalizes understanding of discharge instructions. Opportunity for questioning and answers were provided. Armband removed by staff, pt discharged from ED. Pt taken to ED entrance via wheel chair.  

## 2021-12-15 ENCOUNTER — Emergency Department (HOSPITAL_COMMUNITY)
Admission: EM | Admit: 2021-12-15 | Discharge: 2021-12-15 | Disposition: A | Discharge status: Home / Self Care | Payer: Medicaid Other | Attending: Emergency Medicine | Admitting: Emergency Medicine

## 2021-12-15 DIAGNOSIS — R197 Diarrhea, unspecified: Secondary | ICD-10-CM | POA: Insufficient documentation

## 2021-12-15 DIAGNOSIS — Z5321 Procedure and treatment not carried out due to patient leaving prior to being seen by health care provider: Secondary | ICD-10-CM | POA: Insufficient documentation

## 2021-12-15 DIAGNOSIS — R112 Nausea with vomiting, unspecified: Secondary | ICD-10-CM | POA: Insufficient documentation

## 2021-12-15 NOTE — ED Triage Notes (Signed)
Per EMS, pt has N/V/D X3 days.  Pt's roommate is ill.    112/76 92HR 94% RA  112CBG

## 2021-12-15 NOTE — ED Notes (Signed)
Pt called for triage, no answer x2 

## 2021-12-15 NOTE — ED Notes (Signed)
Pt called for triage, no response x 1 

## 2022-01-18 ENCOUNTER — Emergency Department (HOSPITAL_BASED_OUTPATIENT_CLINIC_OR_DEPARTMENT_OTHER): Payer: Medicaid Other | Admitting: Radiology

## 2022-01-18 ENCOUNTER — Other Ambulatory Visit: Payer: Self-pay

## 2022-01-18 ENCOUNTER — Encounter (HOSPITAL_BASED_OUTPATIENT_CLINIC_OR_DEPARTMENT_OTHER): Payer: Self-pay

## 2022-01-18 DIAGNOSIS — R197 Diarrhea, unspecified: Secondary | ICD-10-CM | POA: Insufficient documentation

## 2022-01-18 DIAGNOSIS — R079 Chest pain, unspecified: Secondary | ICD-10-CM | POA: Insufficient documentation

## 2022-01-18 DIAGNOSIS — Z1152 Encounter for screening for COVID-19: Secondary | ICD-10-CM | POA: Insufficient documentation

## 2022-01-18 DIAGNOSIS — R0602 Shortness of breath: Secondary | ICD-10-CM | POA: Insufficient documentation

## 2022-01-18 DIAGNOSIS — R519 Headache, unspecified: Secondary | ICD-10-CM | POA: Insufficient documentation

## 2022-01-18 DIAGNOSIS — Z5321 Procedure and treatment not carried out due to patient leaving prior to being seen by health care provider: Secondary | ICD-10-CM | POA: Insufficient documentation

## 2022-01-18 DIAGNOSIS — R059 Cough, unspecified: Secondary | ICD-10-CM | POA: Insufficient documentation

## 2022-01-18 LAB — SARS CORONAVIRUS 2 BY RT PCR: SARS Coronavirus 2 by RT PCR: NEGATIVE

## 2022-01-18 NOTE — ED Triage Notes (Signed)
Pt presents to the ED with cough headache, SHOB, diarrhea, and chest pain when coughing. Pt reports coughing up yellow phlegm. States that these symptoms started today. EKG obtained

## 2022-01-19 ENCOUNTER — Emergency Department (HOSPITAL_BASED_OUTPATIENT_CLINIC_OR_DEPARTMENT_OTHER)
Admission: EM | Admit: 2022-01-19 | Discharge: 2022-01-19 | Discharge status: Against Medical Advice | Payer: Medicaid Other | Attending: Emergency Medicine | Admitting: Emergency Medicine

## 2022-01-19 NOTE — ED Notes (Signed)
Called pt for room x2 without answer

## 2022-01-19 NOTE — ED Notes (Signed)
No answer

## 2022-02-15 ENCOUNTER — Other Ambulatory Visit: Payer: Self-pay

## 2022-02-15 ENCOUNTER — Encounter (HOSPITAL_BASED_OUTPATIENT_CLINIC_OR_DEPARTMENT_OTHER): Payer: Self-pay

## 2022-02-15 ENCOUNTER — Emergency Department (HOSPITAL_BASED_OUTPATIENT_CLINIC_OR_DEPARTMENT_OTHER): Payer: Medicaid Other | Admitting: Radiology

## 2022-02-15 DIAGNOSIS — M25522 Pain in left elbow: Secondary | ICD-10-CM | POA: Diagnosis not present

## 2022-02-15 DIAGNOSIS — Y9389 Activity, other specified: Secondary | ICD-10-CM | POA: Insufficient documentation

## 2022-02-15 DIAGNOSIS — S46912A Strain of unspecified muscle, fascia and tendon at shoulder and upper arm level, left arm, initial encounter: Secondary | ICD-10-CM | POA: Diagnosis not present

## 2022-02-15 DIAGNOSIS — E119 Type 2 diabetes mellitus without complications: Secondary | ICD-10-CM | POA: Insufficient documentation

## 2022-02-15 DIAGNOSIS — S4992XA Unspecified injury of left shoulder and upper arm, initial encounter: Secondary | ICD-10-CM | POA: Diagnosis present

## 2022-02-15 DIAGNOSIS — M7989 Other specified soft tissue disorders: Secondary | ICD-10-CM | POA: Diagnosis not present

## 2022-02-15 DIAGNOSIS — X501XXA Overexertion from prolonged static or awkward postures, initial encounter: Secondary | ICD-10-CM | POA: Diagnosis not present

## 2022-02-15 DIAGNOSIS — S59902A Unspecified injury of left elbow, initial encounter: Secondary | ICD-10-CM | POA: Diagnosis not present
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
## 2022-02-16 ENCOUNTER — Emergency Department (HOSPITAL_BASED_OUTPATIENT_CLINIC_OR_DEPARTMENT_OTHER)
Admission: EM | Admit: 2022-02-16 | Discharge: 2022-02-16 | Disposition: A | Discharge status: Home / Self Care | Payer: Medicaid Other | Attending: Emergency Medicine | Admitting: Emergency Medicine

## 2022-02-16 DIAGNOSIS — S46912A Strain of unspecified muscle, fascia and tendon at shoulder and upper arm level, left arm, initial encounter: Secondary | ICD-10-CM

## 2022-02-16 NOTE — Discharge Instructions (Addendum)
Wear the sling as needed.  Apply ice for thirty minutes at a time, four times a day.  Take your meloxicam once a day. To get additional pain relief, you may take acetaminophen.  Follow up with the orthopedic physician.
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
## 2022-02-16 NOTE — ED Notes (Signed)
Reviewed AVS/discharge instruction with patient. Time allotted for and all questions answered. Patient is agreeable for d/c and escorted to ed exit by staff.  

## 2022-02-21 ENCOUNTER — Encounter: Payer: Self-pay | Admitting: Orthopaedic Surgery

## 2022-02-21 ENCOUNTER — Ambulatory Visit (INDEPENDENT_AMBULATORY_CARE_PROVIDER_SITE_OTHER): Payer: Medicaid Other | Admitting: Orthopaedic Surgery

## 2022-02-21 VITALS — Ht 66.0 in | Wt 270.1 lb

## 2022-02-21 DIAGNOSIS — M79602 Pain in left arm: Secondary | ICD-10-CM
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
## 2022-03-02 ENCOUNTER — Other Ambulatory Visit: Payer: Self-pay

## 2022-03-02 ENCOUNTER — Emergency Department (HOSPITAL_COMMUNITY)
Admission: EM | Admit: 2022-03-02 | Discharge: 2022-03-02 | Discharge status: Against Medical Advice | Payer: Medicaid Other | Attending: Emergency Medicine | Admitting: Emergency Medicine

## 2022-03-02 DIAGNOSIS — M25552 Pain in left hip: Secondary | ICD-10-CM | POA: Insufficient documentation
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
## 2022-05-05 ENCOUNTER — Other Ambulatory Visit: Payer: Self-pay

## 2022-05-05 ENCOUNTER — Emergency Department (HOSPITAL_COMMUNITY)
Admission: EM | Admit: 2022-05-05 | Discharge: 2022-05-06 | Discharge status: Against Medical Advice | Payer: Medicaid Other | Attending: Emergency Medicine | Admitting: Emergency Medicine

## 2022-05-05 ENCOUNTER — Encounter (HOSPITAL_COMMUNITY): Payer: Self-pay | Admitting: Emergency Medicine

## 2022-05-05 DIAGNOSIS — R112 Nausea with vomiting, unspecified: Secondary | ICD-10-CM | POA: Diagnosis not present

## 2022-05-05 DIAGNOSIS — Z5321 Procedure and treatment not carried out due to patient leaving prior to being seen by health care provider: Secondary | ICD-10-CM | POA: Diagnosis not present

## 2022-05-05 DIAGNOSIS — R1012 Left upper quadrant pain: Secondary | ICD-10-CM | POA: Diagnosis not present

## 2022-05-05 LAB — COMPREHENSIVE METABOLIC PANEL
ALT: 12 U/L (ref 0–44)
AST: 17 U/L (ref 15–41)
Albumin: 3.7 g/dL (ref 3.5–5.0)
Alkaline Phosphatase: 72 U/L (ref 38–126)
Anion gap: 6 (ref 5–15)
BUN: 11 mg/dL (ref 6–20)
CO2: 26 mmol/L (ref 22–32)
Calcium: 8.7 mg/dL — ABNORMAL LOW (ref 8.9–10.3)
Chloride: 106 mmol/L (ref 98–111)
Creatinine, Ser: 0.9 mg/dL (ref 0.44–1.00)
GFR, Estimated: >60 mL/min (ref >60)
Glucose, Bld: 89 mg/dL (ref 70–99)
Potassium: 4.3 mmol/L (ref 3.5–5.1)
Sodium: 138 mmol/L (ref 135–145)
Total Bilirubin: 0.6 mg/dL (ref 0.3–1.2)
Total Protein: 7 g/dL (ref 6.5–8.1)

## 2022-05-05 LAB — URINALYSIS, ROUTINE W REFLEX MICROSCOPIC
Bilirubin Urine: NEGATIVE
Glucose, UA: NEGATIVE mg/dL
Ketones, ur: NEGATIVE mg/dL
Nitrite: NEGATIVE
Protein, ur: 100 mg/dL — AB
Specific Gravity, Urine: 1.02 (ref 1.005–1.030)
pH: 8 (ref 5.0–8.0)

## 2022-05-05 LAB — CBC
HCT: 44.8 % (ref 36.0–46.0)
Hemoglobin: 14.2 g/dL (ref 12.0–15.0)
MCH: 29.8 pg (ref 26.0–34.0)
MCHC: 31.7 g/dL (ref 30.0–36.0)
MCV: 94.1 fL (ref 80.0–100.0)
Platelets: 234 10*3/uL (ref 150–400)
RBC: 4.76 MIL/uL (ref 3.87–5.11)
RDW: 12.9 % (ref 11.5–15.5)
WBC: 8.5 10*3/uL (ref 4.0–10.5)
nRBC: 0 % (ref 0.0–0.2)

## 2022-05-05 LAB — URINALYSIS, MICROSCOPIC (REFLEX): RBC / HPF: >50 RBC/hpf (ref 0–5)

## 2022-05-05 LAB — I-STAT BETA HCG BLOOD, ED (MC, WL, AP ONLY): I-stat hCG, quantitative: 9.7 m[IU]/mL — ABNORMAL HIGH (ref <5)

## 2022-05-05 LAB — LIPASE, BLOOD: Lipase: 33 U/L (ref 11–51)

## 2022-05-05 MED ORDER — ONDANSETRON 4 MG PO TBDP: 4.0000 mg | ORAL_TABLET | Freq: Once | ORAL | Status: DC

## 2022-05-05 NOTE — ED Notes (Signed)
Pt walks in and out of ER at times (this is same reason triage was delayed). Unable to provide PO zofran at this time for this reason.

## 2022-05-05 NOTE — ED Provider Triage Note (Signed)
Emergency Medicine Provider Triage Evaluation Note  Yoshiko Zuno , a 32 y.o. female  was evaluated in triage.  Pt complains of LUQ abdominal pain since earlier this evening, denies any fever, chills, but endorses some nausea, vomiting x 1. Reports no similar abdominal pain.  Review of Systems  Positive: Abdominal pain, nausea, vomiting Negative: Fever, chills  Physical Exam  BP 101/65 (BP Location: Left Arm)   Pulse 90   Temp 98.5 F (36.9 C) (Oral)   Resp 18   Wt 110 kg   LMP 05/04/2022 (Approximate)   SpO2 99%   BMI 37.98 kg/m  Gen:   Awake, no distress   Resp:  Normal effort  MSK:   Moves extremities without difficulty  Other:  Mild ttp in LUQ, no rebound rigidity, guarding  Medical Decision Making  Medically screening exam initiated at 9:53 PM.  Appropriate orders placed.  Elnita Maxwell Lisandra Lovings was informed that the remainder of the evaluation will be completed by another provider, this initial triage assessment does not replace that evaluation, and the importance of remaining in the ED until their evaluation is complete.  Workup initiated in triage   Dorien Chihuahua 05/05/22 2158

## 2022-05-05 NOTE — ED Triage Notes (Addendum)
Presents for LUQ pain that started after eating tonight. Endorses N/V. Denies radiation of pain, fever, chills, or stool changes.  No h/o abd surgery.  No previous episodes of similar pain. Currently menstruating.  Pt states she is not taking any daily meds (chart states DM, but pt denies this)

## 2022-05-06 NOTE — ED Notes (Signed)
Pt did not answer for room.

## 2022-06-08 DIAGNOSIS — F819 Developmental disorder of scholastic skills, unspecified: Secondary | ICD-10-CM | POA: Diagnosis not present

## 2022-06-08 DIAGNOSIS — F4312 Post-traumatic stress disorder, chronic: Secondary | ICD-10-CM | POA: Diagnosis not present

## 2022-06-08 DIAGNOSIS — F602 Antisocial personality disorder: Secondary | ICD-10-CM | POA: Diagnosis not present

## 2022-06-13 ENCOUNTER — Emergency Department (HOSPITAL_COMMUNITY)
Admission: EM | Admit: 2022-06-13 | Discharge: 2022-06-13 | Discharge status: Against Medical Advice | Payer: Medicaid Other | Attending: Emergency Medicine | Admitting: Emergency Medicine

## 2022-06-13 ENCOUNTER — Encounter (HOSPITAL_COMMUNITY): Payer: Self-pay

## 2022-06-13 ENCOUNTER — Other Ambulatory Visit: Payer: Self-pay

## 2022-06-13 DIAGNOSIS — R1110 Vomiting, unspecified: Secondary | ICD-10-CM | POA: Insufficient documentation

## 2022-06-13 DIAGNOSIS — Z5321 Procedure and treatment not carried out due to patient leaving prior to being seen by health care provider: Secondary | ICD-10-CM | POA: Diagnosis not present

## 2022-06-13 LAB — POC URINE PREG, ED: Preg Test, Ur: NEGATIVE

## 2022-06-13 LAB — COMPREHENSIVE METABOLIC PANEL
ALT: 14 U/L (ref 0–44)
AST: 19 U/L (ref 15–41)
Albumin: 3.6 g/dL (ref 3.5–5.0)
Alkaline Phosphatase: 76 U/L (ref 38–126)
Anion gap: 10 (ref 5–15)
BUN: 17 mg/dL (ref 6–20)
CO2: 26 mmol/L (ref 22–32)
Calcium: 8.7 mg/dL — ABNORMAL LOW (ref 8.9–10.3)
Chloride: 103 mmol/L (ref 98–111)
Creatinine, Ser: 1.14 mg/dL — ABNORMAL HIGH (ref 0.44–1.00)
GFR, Estimated: >60 mL/min (ref >60)
Glucose, Bld: 103 mg/dL — ABNORMAL HIGH (ref 70–99)
Potassium: 4.7 mmol/L (ref 3.5–5.1)
Sodium: 139 mmol/L (ref 135–145)
Total Bilirubin: 0.5 mg/dL (ref 0.3–1.2)
Total Protein: 7.1 g/dL (ref 6.5–8.1)

## 2022-06-13 LAB — URINALYSIS, ROUTINE W REFLEX MICROSCOPIC
Glucose, UA: NEGATIVE mg/dL
Ketones, ur: NEGATIVE mg/dL
Nitrite: NEGATIVE
Protein, ur: 100 mg/dL — AB
Specific Gravity, Urine: 1.025 (ref 1.005–1.030)
WBC, UA: >50 WBC/hpf (ref 0–5)
pH: 7 (ref 5.0–8.0)

## 2022-06-13 LAB — CBC
HCT: 43.8 % (ref 36.0–46.0)
Hemoglobin: 14.2 g/dL (ref 12.0–15.0)
MCH: 30.3 pg (ref 26.0–34.0)
MCHC: 32.4 g/dL (ref 30.0–36.0)
MCV: 93.6 fL (ref 80.0–100.0)
Platelets: 234 10*3/uL (ref 150–400)
RBC: 4.68 MIL/uL (ref 3.87–5.11)
RDW: 12.9 % (ref 11.5–15.5)
WBC: 11.3 10*3/uL — ABNORMAL HIGH (ref 4.0–10.5)
nRBC: 0 % (ref 0.0–0.2)

## 2022-06-13 LAB — PREGNANCY, URINE: Preg Test, Ur: NEGATIVE

## 2022-06-13 LAB — I-STAT BETA HCG BLOOD, ED (MC, WL, AP ONLY): I-stat hCG, quantitative: 10.6 m[IU]/mL — ABNORMAL HIGH (ref <5)

## 2022-06-13 LAB — LIPASE, BLOOD: Lipase: 35 U/L (ref 11–51)

## 2022-06-13 NOTE — ED Notes (Signed)
Pt called multiple times no answer 

## 2022-06-13 NOTE — ED Notes (Addendum)
Pt went outside about a hour ago pt did not come back in

## 2022-06-13 NOTE — ED Triage Notes (Signed)
Emesis for the past 3 days. Pt states she was only on her menstrual cycle for 3 days last month. This is not normal for patient. Pt has not started her cycle this month.   LMP 05/12/2022.

## 2022-06-14 ENCOUNTER — Telehealth: Payer: Self-pay | Admitting: *Deleted

## 2022-06-14 NOTE — Transitions of Care (Post Inpatient/ED Visit) (Signed)
   06/14/2022  Name: Robin Petersen MRN: [id number] DOB: 12-24-1990  Today's TOC FU Call Status: Today's TOC FU Call Status:: Successful TOC FU Call Competed TOC FU Call Complete Date: 06/14/22  Transition Care Management Follow-up Telephone Call Date of Discharge: 06/14/22 (LWBS) Discharge Facility: [facility]) Type of Discharge: Emergency Department Reason for ED Visit: Other: (abdominal pain) How have you been since you were released from the hospital?: Same Any questions or concerns?: No  Items Reviewed: Did you receive and understand the discharge instructions provided?:  (LWBS) Medications obtained and verified?: Yes (Medications Reviewed) Any new allergies since your discharge?: No Dietary orders reviewed?: NA Do you have support at home?: Yes People in Home: significant other Name of Support/Comfort Primary Source: Robin Petersen and Equipment/Supplies: [place] Ordered?: NA Any new equipment or medical supplies ordered?: NA  Functional Questionnaire: Do you need assistance with bathing/showering or dressing?: No Do you need assistance with meal preparation?: No Do you need assistance with eating?: No Do you have difficulty maintaining continence: No Do you need assistance with getting out of bed/getting out of a chair/moving?: No Do you have difficulty managing or taking your medications?: No  Follow up appointments reviewed: PCP Follow-up appointment confirmed?: [facility] Follow-up appointment confirmed?: NA Do you need transportation to your follow-up appointment?: No Do you understand care options if your condition(s) worsen?: Yes-patient verbalized understanding  SDOH Interventions Today    Flowsheet Row Most Recent Value  SDOH Interventions   Transportation Interventions Intervention Not Indicated      RNCM advised patient to contact [facility] and update PCP information. Patient reports [facility] as her PCP for 4 years. Patient voiced understanding.  Lurena Joiner RN, BSN [facility] RN Care Coordinator [phone]

## 2022-07-22 ENCOUNTER — Encounter (HOSPITAL_COMMUNITY): Payer: Self-pay

## 2022-07-22 ENCOUNTER — Emergency Department (HOSPITAL_COMMUNITY)
Admission: EM | Admit: 2022-07-22 | Discharge: 2022-07-22 | Disposition: A | Discharge status: Home / Self Care | Payer: Medicaid Other | Attending: Emergency Medicine | Admitting: Emergency Medicine

## 2022-07-22 ENCOUNTER — Other Ambulatory Visit: Payer: Self-pay

## 2022-07-22 DIAGNOSIS — R42 Dizziness and giddiness: Secondary | ICD-10-CM | POA: Insufficient documentation

## 2022-07-22 DIAGNOSIS — E119 Type 2 diabetes mellitus without complications: Secondary | ICD-10-CM | POA: Diagnosis not present

## 2022-07-22 DIAGNOSIS — I959 Hypotension, unspecified: Secondary | ICD-10-CM | POA: Diagnosis not present

## 2022-07-22 DIAGNOSIS — R110 Nausea: Secondary | ICD-10-CM | POA: Diagnosis not present

## 2022-07-22 LAB — CBC WITH DIFFERENTIAL/PLATELET
Abs Immature Granulocytes: 0.03 10*3/uL (ref 0.00–0.07)
Basophils Absolute: 0.1 10*3/uL (ref 0.0–0.1)
Basophils Relative: 1 %
Eosinophils Absolute: 0.1 10*3/uL (ref 0.0–0.5)
Eosinophils Relative: 1 %
HCT: 39.9 % (ref 36.0–46.0)
Hemoglobin: 12.8 g/dL (ref 12.0–15.0)
Immature Granulocytes: 0 %
Lymphocytes Relative: 19 %
Lymphs Abs: 2.1 10*3/uL (ref 0.7–4.0)
MCH: 30.1 pg (ref 26.0–34.0)
MCHC: 32.1 g/dL (ref 30.0–36.0)
MCV: 93.9 fL (ref 80.0–100.0)
Monocytes Absolute: 0.7 10*3/uL (ref 0.1–1.0)
Monocytes Relative: 6 %
Neutro Abs: 8 10*3/uL — ABNORMAL HIGH (ref 1.7–7.7)
Neutrophils Relative %: 73 %
Platelets: 206 10*3/uL (ref 150–400)
RBC: 4.25 MIL/uL (ref 3.87–5.11)
RDW: 13.2 % (ref 11.5–15.5)
WBC: 10.9 10*3/uL — ABNORMAL HIGH (ref 4.0–10.5)
nRBC: 0 % (ref 0.0–0.2)

## 2022-07-22 LAB — BASIC METABOLIC PANEL
Anion gap: 9 (ref 5–15)
BUN: 15 mg/dL (ref 6–20)
CO2: 21 mmol/L — ABNORMAL LOW (ref 22–32)
Calcium: 7.9 mg/dL — ABNORMAL LOW (ref 8.9–10.3)
Chloride: 109 mmol/L (ref 98–111)
Creatinine, Ser: 1.03 mg/dL — ABNORMAL HIGH (ref 0.44–1.00)
GFR, Estimated: >60 mL/min (ref >60)
Glucose, Bld: 91 mg/dL (ref 70–99)
Potassium: 4 mmol/L (ref 3.5–5.1)
Sodium: 139 mmol/L (ref 135–145)

## 2022-07-22 LAB — I-STAT BETA HCG BLOOD, ED (MC, WL, AP ONLY): I-stat hCG, quantitative: 6.4 m[IU]/mL — ABNORMAL HIGH (ref <5)

## 2022-07-22 MED ORDER — ONDANSETRON HCL 4 MG/2ML IJ SOLN
4.0000 mg | Freq: Once | INTRAMUSCULAR | Status: AC
  Administered 2022-07-22: 4 mg via INTRAVENOUS
  Filled 2022-07-22: qty 2

## 2022-07-22 MED ORDER — SODIUM CHLORIDE 0.9 % IV BOLUS
1000.0000 mL | Freq: Once | INTRAVENOUS | Status: AC
  Administered 2022-07-22: 1000 mL via INTRAVENOUS

## 2022-07-22 MED ORDER — MECLIZINE HCL 25 MG PO TABS: 25.0000 mg | ORAL_TABLET | Freq: Three times a day (TID) | ORAL | 0 refills | Status: DC | PRN

## 2022-07-22 MED ORDER — MECLIZINE HCL 25 MG PO TABS
25.0000 mg | ORAL_TABLET | Freq: Once | ORAL | Status: AC
  Administered 2022-07-22: 25 mg via ORAL
  Filled 2022-07-22: qty 1

## 2022-07-22 MED ORDER — ONDANSETRON 4 MG PO TBDP: 4.0000 mg | ORAL_TABLET | Freq: Three times a day (TID) | ORAL | 0 refills | Status: DC | PRN

## 2022-07-22 NOTE — ED Provider Notes (Signed)
[place] EMERGENCY DEPARTMENT AT [facility] Provider Note   CSN: [id number] Arrival date & time: 07/22/22  0203     History  Chief Complaint  Patient presents with   Dizziness    Robin Petersen is a 32 y.o. female.  The history is provided by the patient and medical records.  Dizziness Associated symptoms: nausea    32 year old female with history of diabetes, vertigo, presenting to the ED with dizziness.  She was picked up by EMS on the side of the road while attempting to walk to the hospital to be seen for dizziness.  States this has been ongoing all day today.  She describes a room spinning sensation similar to her vertigo episodes in the past.  Symptoms do seem worse with rapid change in position or rapid turning of her head.  This created some nausea but no vomiting.  She has been able to eat and drink well today.  She has not had any new numbness or weakness of her extremities.  She has not taken her vertigo medication in about 4 months because it makes her nauseated.  Home Medications Prior to Admission medications   Medication Sig Start Date End Date Taking? Authorizing Provider  acetaminophen (TYLENOL) 500 MG tablet Take 1 tablet (500 mg total) by mouth every 6 (six) hours as needed. Patient not taking: Reported on 06/14/2022 10/18/19   Michela Pitcher A, PA-C  albuterol (VENTOLIN HFA) 108 (90 Base) MCG/ACT inhaler Inhale 1 puff into the lungs every 6 (six) hours as needed for wheezing or shortness of breath.    [provider]  buPROPion (WELLBUTRIN XL) 150 MG 24 hr tablet Take 150 mg by mouth daily.    [provider]  ferrous sulfate 325 (65 FE) MG EC tablet Take 325 mg by mouth daily with breakfast.    [provider]  fluticasone-salmeterol (ADVAIR) 100-50 MCG/ACT AEPB Inhale 1 puff into the lungs 2 (two) times daily.    [provider]  ibuprofen (ADVIL) 600 MG tablet Take 1 tablet (600 mg total) by mouth every 6 (six)  hours as needed for mild pain or moderate pain. 06/04/21   Edwin Dada P, DO  lidocaine (LIDODERM) 5 % Place 1 patch onto the skin daily. Remove & Discard patch within 12 hours or as directed by MD Patient not taking: Reported on 05/05/2022 12/03/21   Sabas Sous, MD  loratadine (CLARITIN) 10 MG tablet Take 10 mg by mouth daily.    [provider]  meloxicam (MOBIC) 15 MG tablet Take 15 mg by mouth daily.    [provider]  methocarbamol (ROBAXIN) 500 MG tablet Take 1 tablet (500 mg total) by mouth every 8 (eight) hours as needed for muscle spasms. Patient not taking: Reported on 06/14/2022 12/03/21   Sabas Sous, MD  Misc. Devices ([employer] CRUTCHES) MISC 1 each by Does not apply route daily. Patient not taking: Reported on 06/14/2022 06/04/21   Edwin Dada P, DO  naproxen (NAPROSYN) 500 MG tablet Take 500 mg by mouth 2 (two) times daily as needed.    [provider]  ondansetron (ZOFRAN) 4 MG tablet Take 1 tablet (4 mg total) by mouth every 6 (six) hours. Patient not taking: Reported on 05/05/2022 12/20/18   Ronnie Doss A, PA-C  ondansetron (ZOFRAN-ODT) 4 MG disintegrating tablet Take 1 tablet (4 mg total) by mouth every 8 (eight) hours as needed for nausea or vomiting. 11/02/21   Sabas Sous, MD  Vitamin D, Ergocalciferol, (DRISDOL) 1.25 MG (50000 UNIT) CAPS capsule Take 50,000 Units by mouth every 7 (seven) days.    [provider]      Allergies    Patient has no known allergies.    Review of Systems   Review of Systems  Gastrointestinal:  Positive for nausea.  Neurological:  Positive for dizziness.  All other systems reviewed and are negative.   Physical Exam Updated Vital Signs BP (!) 120/59 (BP Location: Right Arm)   Pulse 61   Temp 98.2 F (36.8 C) (Oral)   Resp 16   Ht 5\' 5"  (1.651 m)   Wt 113.4 kg   SpO2 98%   BMI 41.60 kg/m   Physical Exam Vitals and nursing note reviewed.  Constitutional:      Appearance: She is  well-developed.  HENT:     Head: Normocephalic and atraumatic.  Eyes:     Conjunctiva/sclera: Conjunctivae normal.     Pupils: Pupils are equal, round, and reactive to light.     Comments: Right sclera is hazy, chronic appearing  Cardiovascular:     Rate and Rhythm: Normal rate and regular rhythm.     Heart sounds: Normal heart sounds.  Pulmonary:     Effort: Pulmonary effort is normal.     Breath sounds: Normal breath sounds.  Abdominal:     General: Bowel sounds are normal.     Palpations: Abdomen is soft.  Musculoskeletal:        General: Normal range of motion.     Cervical back: Normal range of motion.  Skin:    General: Skin is warm and dry.  Neurological:     Mental Status: She is alert and oriented to person, place, and time.     Comments: AAOx3, answering questions and following commands appropriately; equal strength UE and LE bilaterally; CN grossly intact; moves all extremities appropriately without ataxia; no focal neuro deficits or facial asymmetry appreciated     ED Results / Procedures / Treatments   Labs (all labs ordered are listed, but only abnormal results are displayed) Labs Reviewed  CBC WITH DIFFERENTIAL/PLATELET - Abnormal; Notable for the following components:      Result Value   WBC 10.9 (*)    Neutro Abs 8.0 (*)    All other components within normal limits  BASIC METABOLIC PANEL - Abnormal; Notable for the following components:   CO2 21 (*)    Creatinine, Ser 1.03 (*)    Calcium 7.9 (*)    All other components within normal limits  I-STAT BETA HCG BLOOD, ED (MC, [facility], AP ONLY) - Abnormal; Notable for the following components:   I-stat hCG, quantitative 6.4 (*)    All other components within normal limits    EKG None  Radiology No results found.  Procedures Procedures    Medications Ordered in ED Medications  sodium chloride 0.9 % bolus 1,000 mL (0 mLs Intravenous Stopped 07/22/22 0413)  ondansetron (ZOFRAN) injection 4 mg (4 mg  Intravenous Given 07/22/22 0218)  meclizine (ANTIVERT) tablet 25 mg (25 mg Oral Given 07/22/22 0218)    ED Course/ Medical Decision Making/ A&P                             Medical Decision Making Amount and/or Complexity of Data Reviewed Labs: ordered. ECG/medicine tests: ordered and independent interpretation performed.  Risk Prescription drug management.   32 year old female here with dizziness.  History  of vertigo and reports this feels similar.  Symptoms worse with rapid movement or turning her head quickly.  Some nausea without vomiting.  She is awake, alert, oriented.  Her neurologic exam is nonfocal.  Suspect this is likely recurrence of her vertigo.  Will treat symptomatically here with IV fluids, Zofran, meclizine.  Will check basic labs.  Will reassess.  4:36 AM Patient states she is feeling better after medications and IVF.  Nausea resolved, she is tolerating PO.  Labs reassuring--no significant leukocytosis or electrolyte derangement.  hcg minimally positive, similar to prior.  She is not concerned for pregnancy at this time, suspect false positive.  Stable for discharge.  Will have her continue meclizine PRN, push oral fluids.  Close follow-up with PCP encouraged. Return here for new concerns.  Final Clinical Impression(s) / ED Diagnoses Final diagnoses:  Dizziness    Rx / DC Orders ED Discharge Orders          Ordered    meclizine (ANTIVERT) 25 MG tablet  3 times daily PRN        07/22/22 0437    ondansetron (ZOFRAN-ODT) 4 MG disintegrating tablet  Every 8 hours PRN        07/22/22 0437              Garlon Hatchet, PA-C 07/22/22 0444    Nira Conn, MD 07/23/22 [phone]

## 2022-07-22 NOTE — ED Triage Notes (Signed)
Patient arrives via ems secondary feeling dizzy. Hx vertigo.

## 2022-07-22 NOTE — Discharge Instructions (Signed)
Take the prescribed medication as directed.  Make sure to stay well hydrated, eat regularly. Follow-up with your primary care doctor. Return to the ED for new or worsening symptoms.

## 2022-07-24 ENCOUNTER — Telehealth: Payer: Self-pay

## 2022-07-24 NOTE — Transitions of Care (Post Inpatient/ED Visit) (Signed)
   07/24/2022  Name: Robin Petersen MRN: [id number] DOB: April 12, 1990  Today's TOC FU Call Status: Today's TOC FU Call Status:: Unsuccessul Call (1st Attempt) Unsuccessful Call (1st Attempt) Date: 07/24/22  Attempted to reach the patient regarding the most recent Inpatient/ED visit.  Follow Up Plan: Additional outreach attempts will be made to reach the patient to complete the Transitions of Care (Post Inpatient/ED visit) call.   Abelino Derrick, MHA [facility]  Managed [facility] Social Worker [phone]

## 2022-07-25 ENCOUNTER — Telehealth: Payer: Self-pay

## 2022-07-25 NOTE — Transitions of Care (Post Inpatient/ED Visit) (Signed)
   07/25/2022  Name: Robin Petersen MRN: [id number] DOB: Jun 20, 1990  Today's TOC FU Call Status: Today's TOC FU Call Status:: Unsuccessful Call (2nd Attempt) Unsuccessful Call (2nd Attempt) Date: 07/25/22  Attempted to reach the patient regarding the most recent Inpatient/ED visit.  Follow Up Plan: Additional outreach attempts will be made to reach the patient to complete the Transitions of Care (Post Inpatient/ED visit) call.   Abelino Derrick, MHA [facility]  Managed [facility] Social Worker [phone]

## 2022-07-27 ENCOUNTER — Telehealth: Payer: Self-pay

## 2022-07-27 NOTE — Transitions of Care (Post Inpatient/ED Visit) (Signed)
   07/27/2022  Name: Robin Petersen MRN: [id number] DOB: [birth year]  Today's TOC FU Call Status: Today's TOC FU Call Status:: Unsuccessful Call (3rd Attempt) Unsuccessful Call (3rd Attempt) Date: 07/27/22  Attempted to reach the patient regarding the most recent Inpatient/ED visit.  Follow Up Plan: No further outreach attempts will be made at this time. We have been unable to contact the patient.  Abelino Derrick, MHA [facility]  Managed [facility] Social Worker [phone]

## 2022-09-16 ENCOUNTER — Emergency Department (HOSPITAL_COMMUNITY)
Admission: EM | Admit: 2022-09-16 | Discharge: 2022-09-17 | Discharge status: Against Medical Advice | Payer: Medicaid Other | Attending: Emergency Medicine | Admitting: Emergency Medicine

## 2022-09-16 ENCOUNTER — Other Ambulatory Visit: Payer: Self-pay

## 2022-09-16 ENCOUNTER — Emergency Department (HOSPITAL_COMMUNITY): Payer: Medicaid Other

## 2022-09-16 DIAGNOSIS — M25511 Pain in right shoulder: Secondary | ICD-10-CM | POA: Insufficient documentation

## 2022-09-16 DIAGNOSIS — Z5321 Procedure and treatment not carried out due to patient leaving prior to being seen by health care provider: Secondary | ICD-10-CM | POA: Insufficient documentation

## 2022-09-16 NOTE — ED Notes (Signed)
Called pt twice, no answer.

## 2022-09-16 NOTE — ED Triage Notes (Signed)
Patient reports pain at right shoulder onset this morning , denies injury or fall , pain increases with movement .

## 2022-09-21 ENCOUNTER — Emergency Department (HOSPITAL_COMMUNITY)
Admission: EM | Admit: 2022-09-21 | Discharge: 2022-09-21 | Disposition: A | Discharge status: Home / Self Care | Payer: Medicaid Other | Attending: Emergency Medicine | Admitting: Emergency Medicine

## 2022-09-21 ENCOUNTER — Encounter (HOSPITAL_COMMUNITY): Payer: Self-pay

## 2022-09-21 ENCOUNTER — Other Ambulatory Visit: Payer: Self-pay

## 2022-09-21 DIAGNOSIS — M79675 Pain in left toe(s): Secondary | ICD-10-CM | POA: Insufficient documentation

## 2022-09-21 DIAGNOSIS — R000 Tachycardia, unspecified: Secondary | ICD-10-CM | POA: Diagnosis not present

## 2022-09-21 MED ORDER — COLCHICINE 0.6 MG PO TABS: ORAL_TABLET | ORAL | 0 refills | Status: DC

## 2022-09-21 NOTE — ED Provider Notes (Signed)
[place] EMERGENCY DEPARTMENT AT [facility] Provider Note   CSN: [id number] Arrival date & time: 09/21/22  1610     History  Chief Complaint  Patient presents with   Toe Pain    Robin Petersen is a 32 y.o. female.  32 yo F with a chief complaints of left great toe pain.  The patient denies any trauma.  Has had pain to the toe off and on.  Pain is worse with moving the toe up and down.  She denies any fevers.  Pain with ambulation.   Toe Pain       Home Medications Prior to Admission medications   Medication Sig Start Date End Date Taking? Authorizing Provider  colchicine 0.6 MG tablet When you get this filled take 2 pills at the same time by mouth then wait an hour and take the third pill by mouth 09/21/22  Yes Melene Plan, DO  acetaminophen (TYLENOL) 500 MG tablet Take 1 tablet (500 mg total) by mouth every 6 (six) hours as needed. Patient not taking: Reported on 06/14/2022 10/18/19   Michela Pitcher A, PA-C  albuterol (VENTOLIN HFA) 108 (90 Base) MCG/ACT inhaler Inhale 1 puff into the lungs every 6 (six) hours as needed for wheezing or shortness of breath.    [provider]  buPROPion (WELLBUTRIN XL) 150 MG 24 hr tablet Take 150 mg by mouth daily.    [provider]  ferrous sulfate 325 (65 FE) MG EC tablet Take 325 mg by mouth daily with breakfast.    [provider]  fluticasone-salmeterol (ADVAIR) 100-50 MCG/ACT AEPB Inhale 1 puff into the lungs 2 (two) times daily.    [provider]  ibuprofen (ADVIL) 600 MG tablet Take 1 tablet (600 mg total) by mouth every 6 (six) hours as needed for mild pain or moderate pain. 06/04/21   Edwin Dada P, DO  lidocaine (LIDODERM) 5 % Place 1 patch onto the skin daily. Remove & Discard patch within 12 hours or as directed by MD Patient not taking: Reported on 05/05/2022 12/03/21   Sabas Sous, MD  loratadine (CLARITIN) 10 MG tablet Take 10 mg by mouth daily.    [provider]   meclizine (ANTIVERT) 25 MG tablet Take 1 tablet (25 mg total) by mouth 3 (three) times daily as needed for dizziness. 07/22/22   Garlon Hatchet, PA-C  meloxicam (MOBIC) 15 MG tablet Take 15 mg by mouth daily.    [provider]  methocarbamol (ROBAXIN) 500 MG tablet Take 1 tablet (500 mg total) by mouth every 8 (eight) hours as needed for muscle spasms. Patient not taking: Reported on 06/14/2022 12/03/21   Sabas Sous, MD  Misc. Devices ([employer] CRUTCHES) MISC 1 each by Does not apply route daily. Patient not taking: Reported on 06/14/2022 06/04/21   Edwin Dada P, DO  naproxen (NAPROSYN) 500 MG tablet Take 500 mg by mouth 2 (two) times daily as needed.    [provider]  ondansetron (ZOFRAN) 4 MG tablet Take 1 tablet (4 mg total) by mouth every 6 (six) hours. Patient not taking: Reported on 05/05/2022 12/20/18   Ronnie Doss A, PA-C  ondansetron (ZOFRAN-ODT) 4 MG disintegrating tablet Take 1 tablet (4 mg total) by mouth every 8 (eight) hours as needed for nausea. 07/22/22   Garlon Hatchet, PA-C  Vitamin D, Ergocalciferol, (DRISDOL) 1.25 MG (50000 UNIT) CAPS capsule Take 50,000 Units by mouth every 7 (seven) days.    [provider]      Allergies    Patient has no known allergies.    Review of Systems   Review of Systems  Physical Exam Updated Vital Signs BP 99/76   Pulse (!) 109   Temp 99.1 F (37.3 C) (Oral)   Resp 15   Ht 5\' 7"  (1.702 m)   Wt 113.4 kg   LMP 09/05/2022   SpO2 98%   BMI 39.16 kg/m  Physical Exam Vitals and nursing note reviewed.  Constitutional:      General: She is not in acute distress.    Appearance: She is well-developed. She is not diaphoretic.  HENT:     Head: Normocephalic and atraumatic.  Eyes:     Pupils: Pupils are equal, round, and reactive to light.  Cardiovascular:     Rate and Rhythm: Normal rate and regular rhythm.     Heart sounds: No murmur heard.    No friction rub. No gallop.  Pulmonary:     Effort: Pulmonary  effort is normal.     Breath sounds: No wheezing or rales.  Abdominal:     General: There is no distension.     Palpations: Abdomen is soft.     Tenderness: There is no abdominal tenderness.  Musculoskeletal:        General: No tenderness.     Cervical back: Normal range of motion and neck supple.     Comments: There is a mild prominence of the MTP on the left foot compared to the right.  There is no obvious erythema or warmth.  There is mild tenderness with dorsiflexion and plantarflexion of the left great toe.  Pulse motor and sensation are intact.  Skin:    General: Skin is warm and dry.  Neurological:     Mental Status: She is alert and oriented to person, place, and time.  Psychiatric:        Behavior: Behavior normal.     ED Results / Procedures / Treatments   Labs (all labs ordered are listed, but only abnormal results are displayed) Labs Reviewed - No data to display  EKG None  Radiology No results found.  Procedures Procedures    Medications Ordered in ED Medications - No data to display  ED Course/ Medical Decision Making/ A&P                             Medical Decision Making Risk Prescription drug management.   32 yo F with a chief complaint of left great toe pain.  She has pain at the MTP and is atraumatic.  Could be gout.  Will treat with NSAIDs.  Crutches.  PCP follow-up.  She was documented as tachycardic on arrival although on my bedside assessment is not tachycardic on manual count.  6:04 PM:  I have discussed the diagnosis/risks/treatment options with the patient.  Evaluation and diagnostic testing in the emergency department does not suggest an emergent condition requiring admission or immediate intervention beyond what has been performed at this time.  They will follow up with PCP. We also discussed returning to the ED immediately if new or worsening sx occur. We discussed the sx which are most concerning (e.g., sudden worsening pain, fever,  inability to tolerate by mouth) that necessitate immediate return. Medications administered to the patient during their visit and any new prescriptions provided to the patient are listed below.  Medications given during this visit Medications - No data  to display   The patient appears reasonably screen and/or stabilized for discharge and I doubt any other medical condition or other [facility] requiring further screening, evaluation, or treatment in the ED at this time prior to discharge.          Final Clinical Impression(s) / ED Diagnoses Final diagnoses:  Great toe pain, left    Rx / DC Orders ED Discharge Orders          Ordered    colchicine 0.6 MG tablet        09/21/22 1801              Melene Plan, DO 09/21/22 1804

## 2022-09-21 NOTE — ED Triage Notes (Signed)
Pt reports left big toe pain since last night. Denies injury.

## 2022-09-21 NOTE — Discharge Instructions (Addendum)
The most common thing that would make you have pain in that area would be gout.  Treatment for that typically is anti-inflammatory medicines like ibuprofen or naproxen.  If you are still taking the meloxicam on how to keep taking that.  If not the max dosing for the over-the-counter medicines are below.   I did also prescribe you colchicine.  This is a medicine that historically is given for gout and tends to be fairly effective.  Please take it as prescribed.  Please call your family doctor tomorrow and let them know about your visit here.  If this is an ongoing issue they may consider trying to change her diet or might start you on a medicine to try and prevent you from having future episodes.  Take 4 over the counter ibuprofen tablets 3 times a day or 2 over-the-counter naproxen tablets twice a day for pain. Also take tylenol 1000mg (2 extra strength) four times a day.

## 2022-09-21 NOTE — ED Notes (Signed)
Patient ambulated using crutches without assistance.

## 2022-09-21 NOTE — Progress Notes (Signed)
Orthopedic Tech Progress Note Patient Details:  Makinsey Pepitone 10/02/1990 [id number]  Ortho Devices Type of Ortho Device: Crutches Ortho Device/Splint Location: with pt Ortho Device/Splint Interventions: Application, Ordered, Adjustment   Post Interventions Patient Tolerated: Ambulated well, Well Instructions Provided: Poper ambulation with device, Care of device, Adjustment of device  Aariyana Manz Carmine Savoy 09/21/2022, 7:02 PM

## 2022-10-07 ENCOUNTER — Emergency Department (HOSPITAL_COMMUNITY): Payer: BLUE CROSS/BLUE SHIELD

## 2022-10-07 ENCOUNTER — Encounter (HOSPITAL_COMMUNITY): Payer: Self-pay | Admitting: Emergency Medicine

## 2022-10-07 ENCOUNTER — Emergency Department (HOSPITAL_COMMUNITY)
Admission: EM | Admit: 2022-10-07 | Discharge: 2022-10-08 | Disposition: A | Discharge status: Home / Self Care | Payer: BLUE CROSS/BLUE SHIELD | Attending: Emergency Medicine | Admitting: Emergency Medicine

## 2022-10-07 ENCOUNTER — Other Ambulatory Visit: Payer: Self-pay

## 2022-10-07 DIAGNOSIS — L02412 Cutaneous abscess of left axilla: Secondary | ICD-10-CM | POA: Diagnosis not present

## 2022-10-07 DIAGNOSIS — L0291 Cutaneous abscess, unspecified: Secondary | ICD-10-CM

## 2022-10-07 DIAGNOSIS — L02414 Cutaneous abscess of left upper limb: Secondary | ICD-10-CM | POA: Insufficient documentation

## 2022-10-07 MED ORDER — DOXYCYCLINE HYCLATE 100 MG PO TABS
100.0000 mg | ORAL_TABLET | Freq: Once | ORAL | Status: AC
  Administered 2022-10-07: 100 mg via ORAL
  Filled 2022-10-07: qty 1
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
## 2022-10-08 MED ORDER — DOXYCYCLINE HYCLATE 100 MG PO CAPS: 100.0000 mg | ORAL_CAPSULE | Freq: Two times a day (BID) | ORAL | 0 refills | Status: DC

## 2022-10-08 NOTE — Discharge Instructions (Signed)
Take the prescribed medication as directed.  Can do warm compresses a few times a day. Follow-up with your primary care doctor. Return to the ED for new or worsening symptoms.

## 2022-12-07 ENCOUNTER — Other Ambulatory Visit: Payer: Self-pay

## 2022-12-07 ENCOUNTER — Emergency Department (HOSPITAL_COMMUNITY)
Admission: EM | Admit: 2022-12-07 | Discharge: 2022-12-07 | Discharge status: Against Medical Advice | Payer: BLUE CROSS/BLUE SHIELD | Attending: Emergency Medicine | Admitting: Emergency Medicine

## 2022-12-07 DIAGNOSIS — Z5321 Procedure and treatment not carried out due to patient leaving prior to being seen by health care provider: Secondary | ICD-10-CM | POA: Diagnosis not present

## 2022-12-07 DIAGNOSIS — M545 Low back pain, unspecified: Secondary | ICD-10-CM | POA: Diagnosis not present

## 2022-12-07 NOTE — ED Notes (Signed)
Pt left without being seen.

## 2022-12-07 NOTE — ED Provider Triage Note (Signed)
Emergency Medicine Provider Triage Evaluation Note  Keyoni Lindenmeyer , a 32 y.o. female  was evaluated in triage.  Pt complains of low back pain.  Review of Systems  Positive: Low back pain Negative: Focal weakness  Physical Exam  BP 107/75   Pulse 86   Temp 99 F (37.2 C)   Resp 16   Ht 5\' 7"  (1.702 m)   Wt 113.6 kg   SpO2 96%   BMI 39.22 kg/m  Gen:   Awake, no distress   Resp:  Normal effort  MSK:   Patient ambulating with discomfort   Medical Decision Making  Medically screening exam initiated at 5:29 PM.  Appropriate orders placed.  Frederik Pear Lyndsi Ally was informed that the remainder of the evaluation will be completed by another provider, this initial triage assessment does not replace that evaluation, and the importance of remaining in the ED until their evaluation is complete.  Has past medical history of sciatica, feels like her back pain she has had in the past however she is not currently having any referred pain. Denies fever, loss of bowel or bladder.   Smitty Knudsen, PA-C 12/07/22 1731

## 2022-12-07 NOTE — ED Triage Notes (Signed)
Pt here for pain management of lower back pain. Pt has history of sciatica and lower back pain. Ambulatory in triage bp114/74 p 80 spo2 99%

## 2023-01-01 ENCOUNTER — Emergency Department (HOSPITAL_COMMUNITY): Payer: BLUE CROSS/BLUE SHIELD

## 2023-01-01 ENCOUNTER — Emergency Department (HOSPITAL_COMMUNITY)
Admission: EM | Admit: 2023-01-01 | Discharge: 2023-01-01 | Discharge status: Against Medical Advice | Payer: BLUE CROSS/BLUE SHIELD | Attending: Emergency Medicine | Admitting: Emergency Medicine

## 2023-01-01 DIAGNOSIS — M25512 Pain in left shoulder: Secondary | ICD-10-CM | POA: Insufficient documentation

## 2023-01-01 DIAGNOSIS — Z5321 Procedure and treatment not carried out due to patient leaving prior to being seen by health care provider: Secondary | ICD-10-CM | POA: Insufficient documentation

## 2023-01-01 MED ORDER — IBUPROFEN 400 MG PO TABS
600.0000 mg | ORAL_TABLET | Freq: Once | ORAL | Status: AC
  Administered 2023-01-01: 600 mg via ORAL
  Filled 2023-01-01: qty 1

## 2023-01-01 MED ORDER — CYCLOBENZAPRINE HCL 10 MG PO TABS
10.0000 mg | ORAL_TABLET | Freq: Once | ORAL | Status: AC
  Administered 2023-01-01: 10 mg via ORAL
  Filled 2023-01-01: qty 1

## 2023-01-01 NOTE — ED Provider Triage Note (Cosign Needed)
Emergency Medicine Provider Triage Evaluation Note  Kiyah Outten , a 32 y.o. female  was evaluated in triage.  Pt complains of left shoulder pain. She feels it popped out of joint while turning in bed 2 nights ago and then popped back in. Pain since. Ibuprofen and Tylenol without relief. .  Review of Systems  Positive: Shoulder joint pain Negative: Fall injury, numbness, neck pain  Physical Exam  BP 116/72 (BP Location: Right Arm)   Pulse 86   Temp 98.2 F (36.8 C) (Oral)   Resp 15   SpO2 99%  Gen:   Awake, no distress   Resp:  Normal effort  MSK:   Moves extremities (Rt UE, bil LE's) without difficulty. Left UE moves in full range passively, pain on extremes of range.  Other:    Medical Decision Making  Medically screening exam initiated at 6:31 PM.  Appropriate orders placed.  Frederik Pear Adrielle Floresca was informed that the remainder of the evaluation will be completed by another provider, this initial triage assessment does not replace that evaluation, and the importance of remaining in the ED until their evaluation is complete.     Elpidio Anis, PA-C 01/01/23 [id number]

## 2023-01-01 NOTE — ED Triage Notes (Signed)
Pt [place] ED [place] [facility] [place]. Pt c/o left shoulder pain x3 days. Pt states she was sleeping and rotated, heard a pop, rotated again and popped it back into place, but still c/o pain. Pulses / sensation intact. No obvious deformity.   EMS: 132/88 90 HR 16 RR 98% RA

## 2023-02-06 ENCOUNTER — Encounter (HOSPITAL_COMMUNITY): Payer: Self-pay | Admitting: Emergency Medicine

## 2023-02-06 ENCOUNTER — Emergency Department (HOSPITAL_COMMUNITY)
Admission: EM | Admit: 2023-02-06 | Discharge: 2023-02-06 | Disposition: A | Discharge status: Home / Self Care | Payer: BLUE CROSS/BLUE SHIELD | Attending: Emergency Medicine | Admitting: Emergency Medicine

## 2023-02-06 ENCOUNTER — Emergency Department (HOSPITAL_COMMUNITY): Payer: BLUE CROSS/BLUE SHIELD

## 2023-02-06 DIAGNOSIS — M25512 Pain in left shoulder: Secondary | ICD-10-CM | POA: Insufficient documentation

## 2023-02-06 DIAGNOSIS — E119 Type 2 diabetes mellitus without complications: Secondary | ICD-10-CM | POA: Diagnosis not present

## 2023-02-06 MED ORDER — KETOROLAC TROMETHAMINE 60 MG/2ML IM SOLN
30.0000 mg | Freq: Once | INTRAMUSCULAR | Status: AC
  Administered 2023-02-06: 30 mg via INTRAMUSCULAR
  Filled 2023-02-06: qty 2

## 2023-02-06 MED ORDER — OXYCODONE-ACETAMINOPHEN 5-325 MG PO TABS
1.0000 | ORAL_TABLET | Freq: Once | ORAL | Status: AC
  Administered 2023-02-06: 1 via ORAL
  Filled 2023-02-06: qty 1

## 2023-02-06 MED ORDER — METHOCARBAMOL 500 MG PO TABS: 500.0000 mg | ORAL_TABLET | Freq: Three times a day (TID) | ORAL | 0 refills | Status: DC | PRN

## 2023-02-06 MED ORDER — METHOCARBAMOL 500 MG PO TABS
1000.0000 mg | ORAL_TABLET | Freq: Once | ORAL | Status: AC
  Administered 2023-02-06: 1000 mg via ORAL
  Filled 2023-02-06: qty 2

## 2023-02-06 NOTE — ED Provider Notes (Signed)
[place] EMERGENCY DEPARTMENT AT [facility] Provider Note   CSN: [id number] Arrival date & time: 02/06/23  1308     History  Chief Complaint  Patient presents with   Shoulder Pain    Robin Petersen is a 32 y.o. female.   Shoulder Pain Patient presents for left shoulder pain.  Medical history includes DM.  In July of this year, she was seen for a left medial arm abscess.  This was treated with doxycycline.  She came to the ED a month ago for report of shoulder dislocation which had reduced prior to arrival.  She underwent left shoulder x-ray while in the waiting room which did not show any fractures or dislocations.  She left the day without being seen.  She states that she had a sling but her boyfriend lost it.  She has been keeping her arm in a neutral position.  When she does move her left shoulder, she has sharp shooting pains.  She has been treating her pains with Tylenol only.  She has not yet seen an orthopedic doctor.  Patient presents today due to ongoing pain in area.     Home Medications Prior to Admission medications   Medication Sig Start Date End Date Taking? Authorizing Provider  methocarbamol (ROBAXIN) 500 MG tablet Take 1 tablet (500 mg total) by mouth every 8 (eight) hours as needed for muscle spasms. 02/06/23  Yes Gloris Manchester, MD  acetaminophen (TYLENOL) 500 MG tablet Take 1 tablet (500 mg total) by mouth every 6 (six) hours as needed. Patient not taking: Reported on 06/14/2022 10/18/19   Michela Pitcher A, PA-C  albuterol (VENTOLIN HFA) 108 (90 Base) MCG/ACT inhaler Inhale 1 puff into the lungs every 6 (six) hours as needed for wheezing or shortness of breath.    [provider]  buPROPion (WELLBUTRIN XL) 150 MG 24 hr tablet Take 150 mg by mouth daily.    [provider]  colchicine 0.6 MG tablet When you get this filled take 2 pills at the same time by mouth then wait an hour and take the third pill by mouth 09/21/22   Melene Plan, DO   doxycycline (VIBRAMYCIN) 100 MG capsule Take 1 capsule (100 mg total) by mouth 2 (two) times daily. 10/08/22   Garlon Hatchet, PA-C  ferrous sulfate 325 (65 FE) MG EC tablet Take 325 mg by mouth daily with breakfast.    [provider]  fluticasone-salmeterol (ADVAIR) 100-50 MCG/ACT AEPB Inhale 1 puff into the lungs 2 (two) times daily.    [provider]  ibuprofen (ADVIL) 600 MG tablet Take 1 tablet (600 mg total) by mouth every 6 (six) hours as needed for mild pain or moderate pain. 06/04/21   Edwin Dada P, DO  lidocaine (LIDODERM) 5 % Place 1 patch onto the skin daily. Remove & Discard patch within 12 hours or as directed by MD Patient not taking: Reported on 05/05/2022 12/03/21   Sabas Sous, MD  loratadine (CLARITIN) 10 MG tablet Take 10 mg by mouth daily.    [provider]  meclizine (ANTIVERT) 25 MG tablet Take 1 tablet (25 mg total) by mouth 3 (three) times daily as needed for dizziness. 07/22/22   Garlon Hatchet, PA-C  meloxicam (MOBIC) 15 MG tablet Take 15 mg by mouth daily.    [provider]  methocarbamol (ROBAXIN) 500 MG tablet Take 1 tablet (500 mg total) by mouth every 8 (eight) hours as needed for muscle spasms. Patient  not taking: Reported on 06/14/2022 12/03/21   Sabas Sous, MD  Misc. Devices ([employer] CRUTCHES) MISC 1 each by Does not apply route daily. Patient not taking: Reported on 06/14/2022 06/04/21   Edwin Dada P, DO  naproxen (NAPROSYN) 500 MG tablet Take 500 mg by mouth 2 (two) times daily as needed.    [provider]  ondansetron (ZOFRAN) 4 MG tablet Take 1 tablet (4 mg total) by mouth every 6 (six) hours. Patient not taking: Reported on 05/05/2022 12/20/18   Ronnie Doss A, PA-C  ondansetron (ZOFRAN-ODT) 4 MG disintegrating tablet Take 1 tablet (4 mg total) by mouth every 8 (eight) hours as needed for nausea. 07/22/22   Garlon Hatchet, PA-C  Vitamin D, Ergocalciferol, (DRISDOL) 1.25 MG (50000 UNIT) CAPS capsule Take 50,000  Units by mouth every 7 (seven) days.    [provider]      Allergies    Patient has no known allergies.    Review of Systems   Review of Systems  Musculoskeletal:  Positive for arthralgias.  All other systems reviewed and are negative.   Physical Exam Updated Vital Signs BP 107/84   Pulse 99   Temp 99.1 F (37.3 C)   Resp 18   SpO2 99%  Physical Exam Vitals and nursing note reviewed.  Constitutional:      General: She is not in acute distress.    Appearance: Normal appearance. She is well-developed. She is not ill-appearing, toxic-appearing or diaphoretic.  HENT:     Head: Normocephalic and atraumatic.     Right Ear: External ear normal.     Left Ear: External ear normal.     Nose: Nose normal.     Mouth/Throat:     Mouth: Mucous membranes are moist.  Eyes:     Extraocular Movements: Extraocular movements intact.     Conjunctiva/sclera: Conjunctivae normal.  Cardiovascular:     Rate and Rhythm: Normal rate and regular rhythm.  Pulmonary:     Effort: Pulmonary effort is normal. No respiratory distress.  Abdominal:     General: There is no distension.     Palpations: Abdomen is soft.     Tenderness: There is no abdominal tenderness.  Musculoskeletal:        General: No swelling or deformity.     Cervical back: Normal range of motion and neck supple.     Comments: ROM limited by pain  Skin:    General: Skin is warm and dry.     Coloration: Skin is not jaundiced or pale.  Neurological:     General: No focal deficit present.     Mental Status: She is alert and oriented to person, place, and time.     Cranial Nerves: No cranial nerve deficit.     Sensory: No sensory deficit.     Motor: No weakness.     Coordination: Coordination normal.  Psychiatric:        Mood and Affect: Mood normal.        Behavior: Behavior normal.     ED Results / Procedures / Treatments   Labs (all labs ordered are listed, but only abnormal results are displayed) Labs  Reviewed - No data to display  EKG None  Radiology DG Shoulder Left  Result Date: 02/06/2023 CLINICAL DATA:  Left shoulder pain. EXAM: LEFT SHOULDER - 2+ VIEW COMPARISON:  Left shoulder radiographs 01/01/2023. FINDINGS: Three views of the left shoulder. No evidence of fracture or dislocation. There is no evidence of  arthropathy or other focal bone abnormality. Subdermal contraceptive device is noted on the axillary view. IMPRESSION: Negative left shoulder radiographs. Electronically Signed   By: Orvan Falconer M.D.   On: 02/06/2023 09:19    Procedures Procedures    Medications Ordered in ED Medications  ketorolac (TORADOL) injection 30 mg (30 mg Intramuscular Given 02/06/23 0846)  oxyCODONE-acetaminophen (PERCOCET/ROXICET) 5-325 MG per tablet 1 tablet (1 tablet Oral Given 02/06/23 0846)  methocarbamol (ROBAXIN) tablet 1,000 mg (1,000 mg Oral Given 02/06/23 0846)    ED Course/ Medical Decision Making/ A&P                                 Medical Decision Making Amount and/or Complexity of Data Reviewed Radiology: ordered.  Risk Prescription drug management.   Patient presenting for ongoing left shoulder pain.  This is after a reported dislocation that occurred a month ago.  On arrival in the ED, vital signs are normal.  Patient is well-appearing on exam.  I do not appreciate any deformity to left shoulder.  She does have intact range of motion of the shoulder but this is limited by pain.  Patient was seen previously for abscess in left medial upper arm.  Currently, there are no concerning skin findings on area of left shoulder or upper arm.  X-ray imaging was ordered.  Multimodal pain control was ordered in addition to left arm sling.  Patient had improved pain while in the ED.  X-ray does not show any acute findings.  She was counseled on immobilization with sling, gentle range of motion exercises, multimodal pain control, and importance of orthopedic follow-up.  She was discharged  in good condition.        Final Clinical Impression(s) / ED Diagnoses Final diagnoses:  Acute pain of left shoulder    Rx / DC Orders ED Discharge Orders          Ordered    methocarbamol (ROBAXIN) 500 MG tablet  Every 8 hours PRN        02/06/23 0941              Gloris Manchester, MD 02/06/23 [phone]

## 2023-02-06 NOTE — Discharge Instructions (Addendum)
Take over-the-counter ibuprofen and Tylenol for pain.  A prescription for a muscle relaxer was sent to your pharmacy.  Take this as needed.  Keep arm in sling for comfort.  You can remove sling several times a day for gentle range of motion exercises.  A list of some of these is attached.  Avoid any activities or movements that significantly worsen your pain.  You should call the number below to set up a follow-up appointment with an orthopedic doctor for further reevaluation.

## 2023-02-06 NOTE — ED Triage Notes (Signed)
Pt here from home with c/o left shoulder pain , no trauma noted , pt had a dislocation 3 weeks ago but none since

## 2023-04-15 ENCOUNTER — Encounter (HOSPITAL_COMMUNITY): Payer: Self-pay

## 2023-04-15 ENCOUNTER — Emergency Department (HOSPITAL_COMMUNITY): Payer: BLUE CROSS/BLUE SHIELD

## 2023-04-15 ENCOUNTER — Other Ambulatory Visit: Payer: Self-pay

## 2023-04-15 ENCOUNTER — Emergency Department (HOSPITAL_COMMUNITY)
Admission: EM | Admit: 2023-04-15 | Discharge: 2023-04-15 | Discharge status: Against Medical Advice | Payer: BLUE CROSS/BLUE SHIELD | Attending: Emergency Medicine | Admitting: Emergency Medicine

## 2023-04-15 DIAGNOSIS — M7731 Calcaneal spur, right foot: Secondary | ICD-10-CM | POA: Diagnosis not present

## 2023-04-15 DIAGNOSIS — M79671 Pain in right foot: Secondary | ICD-10-CM | POA: Diagnosis not present

## 2023-04-15 DIAGNOSIS — Z5321 Procedure and treatment not carried out due to patient leaving prior to being seen by health care provider: Secondary | ICD-10-CM | POA: Diagnosis not present

## 2023-04-15 MED ORDER — HYDROCODONE-ACETAMINOPHEN 5-325 MG PO TABS
1.0000 | ORAL_TABLET | Freq: Once | ORAL | Status: AC
  Administered 2023-04-15: 1 via ORAL
  Filled 2023-04-15: qty 1

## 2023-04-15 NOTE — ED Triage Notes (Signed)
Pt c.o right foot pain, pt has blisters to her right big toe x1 month.

## 2023-04-15 NOTE — ED Provider Triage Note (Signed)
Emergency Medicine Provider Triage Evaluation Note  Robin Petersen , a 33 y.o. female  was evaluated in triage.  Pt complains of right foot pain.  Patient endorses right foot pain for the past month.  Says she was wearing boots that was rubbing against her toe.  She has pain over all of her toes now able to ambulate on them with pain.  Also has scabbing over the big toe.  Review of Systems  Positive: Right foot pain Negative: Ankle pain  Physical Exam  BP 98/74 (BP Location: Right Arm)   Pulse (!) 109   Temp 97.8 F (36.6 C)   Resp 20   Ht 5\' 6"  (1.676 m)   Wt 113.4 kg   LMP 03/14/2023   SpO2 100%   BMI 40.35 kg/m  Gen:   Awake, no distress   Resp:  Normal effort  MSK:   Moves extremities without difficulty  Other:    Medical Decision Making  Medically screening exam initiated at 8:55 AM.  Appropriate orders placed.  Frederik Pear Mark Benecke was informed that the remainder of the evaluation will be completed by another provider, this initial triage assessment does not replace that evaluation, and the importance of remaining in the ED until their evaluation is complete.    Maxwell Marion, PA-C 04/15/23 [phone]

## 2023-04-16 ENCOUNTER — Encounter (HOSPITAL_COMMUNITY): Payer: Self-pay | Admitting: Emergency Medicine

## 2023-04-16 ENCOUNTER — Emergency Department (HOSPITAL_BASED_OUTPATIENT_CLINIC_OR_DEPARTMENT_OTHER)
Admission: EM | Admit: 2023-04-16 | Discharge: 2023-04-16 | Discharge status: Against Medical Advice | Payer: BLUE CROSS/BLUE SHIELD | Attending: Emergency Medicine | Admitting: Emergency Medicine

## 2023-04-16 ENCOUNTER — Emergency Department (HOSPITAL_COMMUNITY)
Admission: EM | Admit: 2023-04-16 | Discharge: 2023-04-17 | Disposition: A | Discharge status: Home / Self Care | Payer: BLUE CROSS/BLUE SHIELD | Attending: Emergency Medicine | Admitting: Emergency Medicine

## 2023-04-16 ENCOUNTER — Encounter (HOSPITAL_BASED_OUTPATIENT_CLINIC_OR_DEPARTMENT_OTHER): Payer: Self-pay | Admitting: Emergency Medicine

## 2023-04-16 ENCOUNTER — Other Ambulatory Visit: Payer: Self-pay

## 2023-04-16 DIAGNOSIS — R609 Edema, unspecified: Secondary | ICD-10-CM | POA: Diagnosis not present

## 2023-04-16 DIAGNOSIS — Z5321 Procedure and treatment not carried out due to patient leaving prior to being seen by health care provider: Secondary | ICD-10-CM | POA: Diagnosis not present

## 2023-04-16 DIAGNOSIS — E119 Type 2 diabetes mellitus without complications: Secondary | ICD-10-CM | POA: Insufficient documentation

## 2023-04-16 DIAGNOSIS — M79671 Pain in right foot: Secondary | ICD-10-CM | POA: Insufficient documentation

## 2023-04-16 LAB — COMPREHENSIVE METABOLIC PANEL
ALT: 58 U/L — ABNORMAL HIGH (ref 0–44)
AST: 36 U/L (ref 15–41)
Albumin: 4 g/dL (ref 3.5–5.0)
Alkaline Phosphatase: 54 U/L (ref 38–126)
Anion gap: 11 (ref 5–15)
BUN: 10 mg/dL (ref 6–20)
CO2: 24 mmol/L (ref 22–32)
Calcium: 8.7 mg/dL — ABNORMAL LOW (ref 8.9–10.3)
Chloride: 103 mmol/L (ref 98–111)
Creatinine, Ser: 0.79 mg/dL (ref 0.44–1.00)
GFR, Estimated: >60 mL/min (ref >60)
Glucose, Bld: 78 mg/dL (ref 70–99)
Potassium: 4.1 mmol/L (ref 3.5–5.1)
Sodium: 138 mmol/L (ref 135–145)
Total Bilirubin: 1.1 mg/dL (ref 0.0–1.2)
Total Protein: 7.1 g/dL (ref 6.5–8.1)

## 2023-04-16 LAB — CBC WITH DIFFERENTIAL/PLATELET
Abs Immature Granulocytes: 0.01 10*3/uL (ref 0.00–0.07)
Basophils Absolute: 0.1 10*3/uL (ref 0.0–0.1)
Basophils Relative: 1 %
Eosinophils Absolute: 0.1 10*3/uL (ref 0.0–0.5)
Eosinophils Relative: 1 %
HCT: 41.5 % (ref 36.0–46.0)
Hemoglobin: 13.5 g/dL (ref 12.0–15.0)
Immature Granulocytes: 0 %
Lymphocytes Relative: 25 %
Lymphs Abs: 2.3 10*3/uL (ref 0.7–4.0)
MCH: 30.4 pg (ref 26.0–34.0)
MCHC: 32.5 g/dL (ref 30.0–36.0)
MCV: 93.5 fL (ref 80.0–100.0)
Monocytes Absolute: 0.8 10*3/uL (ref 0.1–1.0)
Monocytes Relative: 9 %
Neutro Abs: 5.9 10*3/uL (ref 1.7–7.7)
Neutrophils Relative %: 64 %
Platelets: 242 10*3/uL (ref 150–400)
RBC: 4.44 MIL/uL (ref 3.87–5.11)
RDW: 13.6 % (ref 11.5–15.5)
WBC: 9.2 10*3/uL (ref 4.0–10.5)
nRBC: 0 % (ref 0.0–0.2)

## 2023-04-16 LAB — PREGNANCY, URINE: Preg Test, Ur: NEGATIVE

## 2023-04-16 LAB — LACTIC ACID, PLASMA: Lactic Acid, Venous: 0.9 mmol/L (ref 0.5–1.9)

## 2023-04-16 NOTE — ED Triage Notes (Signed)
[place] [facility] [place] Per EMS pt c/o blisters and pain in R foot that has been ongoing for approx 2 wks. Per EMS pt is not a diabetic.   EMS VS BP 112/70 HR 80 RR 16 SpO2% 97% CBG 107  Temp 98.3 F

## 2023-04-16 NOTE — ED Triage Notes (Signed)
Pt seen at [facility] had xray done. Left AMA. Labs done today. Pt has blisters on all 5 toes. No signs of distress. Reports pain since Friday.

## 2023-04-16 NOTE — ED Provider Triage Note (Signed)
Emergency Medicine Provider Triage Evaluation Note  Robin Petersen , a 33 y.o. female  was evaluated in triage.  Pt complains of right foot pain and blisters for past two weeks due to rubbing of her boot.  Attempted to be seen at [facility] ED yesterday but left prior to eval by EDP.  XR yesterday not significant for intraosseous abnormality  Review of Systems  Positive: See hpi Negative:   Physical Exam  BP 102/75   Pulse 91   Temp 97.6 F (36.4 C) (Oral)   Resp 17   Ht 5\' 6"  (1.676 m)   Wt 113 kg   LMP 03/14/2023   SpO2 100%   BMI 40.21 kg/m  Gen:   Awake, no distress   Resp:  Normal effort  MSK:   Moves extremities without difficulty  Other:  TTP of digits 1-5 of right foot. Sensation intact. Unable to wiggle toes. Scabing to toe. No appreciable swelling, warmth, erythema.  Medical Decision Making  Medically screening exam initiated at 3:49 PM.  Appropriate orders placed.  Robin Petersen was informed that the remainder of the evaluation will be completed by another provider, this initial triage assessment does not replace that evaluation, and the importance of remaining in the ED until their evaluation is complete.  Labs ordered   Judithann Sheen, [place] 04/16/23 [phone]

## 2023-04-16 NOTE — ED Provider Triage Note (Signed)
Emergency Medicine Provider Triage Evaluation Note  Robin Petersen , a 33 y.o. female  was evaluated in triage.  Pt complains of right foot pain, primarily located in all of her toes.  Patient reports that symptoms began on Friday.  She was seen at [facility], ED but left without being seen.  Also seen today at [facility] where she had lactic, CMP, CBC drawn.  No elevation of white count, no electrolyte derangement, no lactic acid elevation.  Patient also had x-ray imaging completed which showed no acute osseous abnormality.  Review of Systems  Positive:  Negative:   Physical Exam  BP 112/77 (BP Location: Right Arm)   Pulse (!) 105   Temp 98.4 F (36.9 C)   Resp 18   LMP 03/14/2023   SpO2 100%  Gen:   Awake, no distress   Resp:  Normal effort  MSK:   Moves extremities without difficulty  Other:  Blisters to all 5 toes.  She has a 2+ DP pulse. Full range of motion of ankle.    Medical Decision Making  Medically screening exam initiated at 11:02 PM.  Appropriate orders placed.  Robin Petersen was informed that the remainder of the evaluation will be completed by another provider, this initial triage assessment does not replace that evaluation, and the importance of remaining in the ED until their evaluation is complete.  X-ray imaging and labs reviewed.  No elevated lactate, no white count.   Al Decant, PA-C 04/16/23 [id number]

## 2023-04-16 NOTE — ED Notes (Signed)
 Unable to obtain labs in triage

## 2023-04-17 DIAGNOSIS — M79671 Pain in right foot: Secondary | ICD-10-CM | POA: Diagnosis not present

## 2023-04-17 MED ORDER — IBUPROFEN 400 MG PO TABS
600.0000 mg | ORAL_TABLET | Freq: Once | ORAL | Status: AC
  Administered 2023-04-17: 600 mg via ORAL
  Filled 2023-04-17: qty 1

## 2023-04-17 NOTE — ED Provider Notes (Signed)
 [place] EMERGENCY DEPARTMENT AT [facility] Provider Note   CSN: [id number] Arrival date & time: 04/16/23  2246     History  Chief Complaint  Patient presents with   Foot Pain    Robin Petersen is a 33 y.o. female.   Foot Pain  Patient presents for right foot pain.  Medical history includes DM.  Patient states that she was wearing boots for several months.  These boots caused some abrasions on both of her feet.  This has caused pain in right forefoot.  She denies any fevers, chills.  She is now wearing softer footwear.  She has not tried anything for pain.  She took some Benadryl to try and help with the swelling.     Home Medications Prior to Admission medications   Medication Sig Start Date End Date Taking? Authorizing Provider  acetaminophen  (TYLENOL ) 500 MG tablet Take 1 tablet (500 mg total) by mouth every 6 (six) hours as needed. Patient not taking: Reported on 06/14/2022 10/18/19   Fawze, Mina A, PA-C  albuterol  (VENTOLIN  HFA) 108 (90 Base) MCG/ACT inhaler Inhale 1 puff into the lungs every 6 (six) hours as needed for wheezing or shortness of breath.    [provider]  buPROPion (WELLBUTRIN XL) 150 MG 24 hr tablet Take 150 mg by mouth daily.    [provider]  colchicine  0.6 MG tablet When you get this filled take 2 pills at the same time by mouth then wait an hour and take the third pill by mouth 09/21/22   Emil Share, DO  doxycycline  (VIBRAMYCIN ) 100 MG capsule Take 1 capsule (100 mg total) by mouth 2 (two) times daily. 10/08/22   Jarold Olam HERO, PA-C  ferrous sulfate 325 (65 FE) MG EC tablet Take 325 mg by mouth daily with breakfast.    [provider]  fluticasone-salmeterol (ADVAIR) 100-50 MCG/ACT AEPB Inhale 1 puff into the lungs 2 (two) times daily.    [provider]  ibuprofen  (ADVIL ) 600 MG tablet Take 1 tablet (600 mg total) by mouth every 6 (six) hours as needed for mild pain or moderate pain. 06/04/21   Elnor Hila P, DO  lidocaine  (LIDODERM ) 5 % Place 1 patch onto the skin daily. Remove & Discard patch within 12 hours or as directed by MD Patient not taking: Reported on 05/05/2022 12/03/21   Theadore Ozell HERO, MD  loratadine (CLARITIN) 10 MG tablet Take 10 mg by mouth daily.    [provider]  meclizine  (ANTIVERT ) 25 MG tablet Take 1 tablet (25 mg total) by mouth 3 (three) times daily as needed for dizziness. 07/22/22   Jarold Olam HERO, PA-C  meloxicam (MOBIC) 15 MG tablet Take 15 mg by mouth daily.    [provider]  methocarbamol  (ROBAXIN ) 500 MG tablet Take 1 tablet (500 mg total) by mouth every 8 (eight) hours as needed for muscle spasms. Patient not taking: Reported on 06/14/2022 12/03/21   Bero, Michael M, MD  methocarbamol  (ROBAXIN ) 500 MG tablet Take 1 tablet (500 mg total) by mouth every 8 (eight) hours as needed for muscle spasms. 02/06/23   Melvenia Motto, MD  Misc. Devices ([employer] CRUTCHES) MISC 1 each by Does not apply route daily. Patient not taking: Reported on 06/14/2022 06/04/21   Elnor Hila P, DO  naproxen  (NAPROSYN ) 500 MG tablet Take 500 mg by mouth 2 (two) times daily as needed.    [provider]  ondansetron  (ZOFRAN ) 4 MG tablet Take 1  tablet (4 mg total) by mouth every 6 (six) hours. Patient not taking: Reported on 05/05/2022 12/20/18   Rolan Sor A, PA-C  ondansetron  (ZOFRAN -ODT) 4 MG disintegrating tablet Take 1 tablet (4 mg total) by mouth every 8 (eight) hours as needed for nausea. 07/22/22   Jarold Olam HERO, PA-C  Vitamin D, Ergocalciferol, (DRISDOL) 1.25 MG (50000 UNIT) CAPS capsule Take 50,000 Units by mouth every 7 (seven) days.    [provider]      Allergies    Patient has no known allergies.    Review of Systems   Review of Systems  Skin:  Positive for wound.  All other systems reviewed and are negative.   Physical Exam Updated Vital Signs BP 94/75 (BP Location: Left Arm)   Pulse (!) 103   Temp 97.7 F (36.5 C) (Oral)   Resp 16    LMP 03/14/2023   SpO2 100%  Physical Exam Vitals and nursing note reviewed.  Constitutional:      General: She is not in acute distress.    Appearance: Normal appearance. She is well-developed. She is not ill-appearing, toxic-appearing or diaphoretic.  HENT:     Head: Normocephalic and atraumatic.     Right Ear: External ear normal.     Left Ear: External ear normal.     Nose: Nose normal.     Mouth/Throat:     Mouth: Mucous membranes are moist.  Eyes:     Extraocular Movements: Extraocular movements intact.     Conjunctiva/sclera: Conjunctivae normal.  Cardiovascular:     Rate and Rhythm: Normal rate and regular rhythm.  Pulmonary:     Effort: Pulmonary effort is normal. No respiratory distress.  Abdominal:     General: There is no distension.     Palpations: Abdomen is soft.  Musculoskeletal:        General: No swelling.     Cervical back: Normal range of motion and neck supple.  Skin:    General: Skin is warm and dry.  Neurological:     General: No focal deficit present.     Mental Status: She is alert and oriented to person, place, and time.  Psychiatric:        Mood and Affect: Mood normal.        Behavior: Behavior normal.     ED Results / Procedures / Treatments   Labs (all labs ordered are listed, but only abnormal results are displayed) Labs Reviewed - No data to display  EKG None  Radiology DG Foot Complete Right Result Date: 04/15/2023 CLINICAL DATA:  Pain and blisters on feet EXAM: RIGHT FOOT COMPLETE - 3+ VIEW COMPARISON:  12/15/2018 FINDINGS: Small Achilles and calcaneal spurs. No acute fracture or dislocation. No radiopaque foreign object. No soft tissue gas or significant soft tissue swelling. IMPRESSION: No acute osseous abnormality. Electronically Signed   By: Rockey Kilts M.D.   On: 04/15/2023 10:03    Procedures Procedures    Medications Ordered in ED Medications  ibuprofen  (ADVIL ) tablet 600 mg (has no administration in time range)     ED Course/ Medical Decision Making/ A&P                                 Medical Decision Making  Patient presents for painful blisters on right foot.  This is secondary to boots that she was wearing for several months.  Prior to being bedded in the ED,  patient underwent x-ray imaging of right foot as well as lab work.  Results of workup are reassuring.  On exam, patient is well-appearing.  I do not appreciate any significant swelling to her right foot.  She does have what appear to be well-healing abrasions.  It does not appear to be fungal infection.  Patient reports pain with weightbearing on her forefoot.  Postop shoe was ordered.  Ibuprofen  was ordered for analgesia.  Patient was advised to elevate foot when possible and to continue to wear soft or open toe footwear.  She was discharged in stable condition.        Final Clinical Impression(s) / ED Diagnoses Final diagnoses:  Foot pain, right    Rx / DC Orders ED Discharge Orders     None         Melvenia Motto, MD 04/17/23 [phone]

## 2023-04-17 NOTE — Discharge Instructions (Addendum)
Continue to wear soft or open toe footwear.  Elevate your right foot when possible.  Take ibuprofen and Tylenol as needed for pain.  If you would like to see a foot doctor, telephone number is below.

## 2023-04-22 ENCOUNTER — Ambulatory Visit (HOSPITAL_COMMUNITY)
Admission: EM | Admit: 2023-04-22 | Discharge: 2023-04-22 | Disposition: A | Discharge status: Home / Self Care | Payer: BLUE CROSS/BLUE SHIELD | Attending: Emergency Medicine | Admitting: Emergency Medicine

## 2023-04-22 ENCOUNTER — Encounter (HOSPITAL_COMMUNITY): Payer: Self-pay

## 2023-04-22 DIAGNOSIS — R6884 Jaw pain: Secondary | ICD-10-CM | POA: Diagnosis not present

## 2023-04-22 MED ORDER — KETOROLAC TROMETHAMINE 30 MG/ML IJ SOLN
30.0000 mg | Freq: Once | INTRAMUSCULAR | Status: AC
Start: 2023-04-22 — End: 2023-04-22
  Administered 2023-04-22: 30 mg via INTRAMUSCULAR

## 2023-04-22 MED ORDER — KETOROLAC TROMETHAMINE 30 MG/ML IJ SOLN
INTRAMUSCULAR | Status: AC
  Filled 2023-04-22: qty 1
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
## 2023-04-22 NOTE — ED Notes (Signed)
 Patient is being discharged from the Urgent Care and sent to the Emergency Department via POV . Per [employer], PA, patient is in need of higher level of care due to limited resources. Patient is aware and verbalizes understanding of plan of care.  Vitals:   04/22/23 1357  BP: 101/70  Pulse: 81  Resp: 16  Temp: 98.3 F (36.8 C)  SpO2: 99%
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
## 2023-04-24 DIAGNOSIS — Z113 Encounter for screening for infections with a predominantly sexual mode of transmission: Secondary | ICD-10-CM | POA: Diagnosis not present

## 2023-04-24 DIAGNOSIS — Z114 Encounter for screening for human immunodeficiency virus [HIV]: Secondary | ICD-10-CM | POA: Diagnosis not present

## 2023-05-25 ENCOUNTER — Emergency Department (HOSPITAL_COMMUNITY): Admission: EM | Admit: 2023-05-25 | Discharge: 2023-05-25 | Disposition: A | Discharge status: Home / Self Care

## 2023-05-25 ENCOUNTER — Encounter (HOSPITAL_COMMUNITY): Payer: Self-pay

## 2023-05-25 DIAGNOSIS — L03011 Cellulitis of right finger: Secondary | ICD-10-CM | POA: Diagnosis not present

## 2023-05-25 DIAGNOSIS — E119 Type 2 diabetes mellitus without complications: Secondary | ICD-10-CM | POA: Insufficient documentation

## 2023-05-25 MED ORDER — LIDOCAINE HCL (PF) 1 % IJ SOLN
5.0000 mL | Freq: Once | INTRAMUSCULAR | Status: AC
Start: 2023-05-25 — End: 2023-05-25
  Administered 2023-05-25: 5 mL via INTRADERMAL
  Filled 2023-05-25: qty 30

## 2023-05-25 MED ORDER — DOXYCYCLINE HYCLATE 100 MG PO CAPS: 100.0000 mg | ORAL_CAPSULE | Freq: Two times a day (BID) | ORAL | 0 refills | Status: AC

## 2023-05-25 MED ORDER — DOXYCYCLINE HYCLATE 100 MG PO TABS
100.0000 mg | ORAL_TABLET | Freq: Once | ORAL | Status: AC
  Administered 2023-05-25: 100 mg via ORAL
  Filled 2023-05-25: qty 1

## 2023-05-25 NOTE — Discharge Instructions (Signed)
 We saw you in the ER for the abscess. Please use warm compresses to help express out the fluid.  Please take the antibiotics I have prescribed to help decrease the chance of infection.  Keep the wound clean and dry otherwise, and allow for any drainage to occur. Please follow up with primary care provider to be rechecked in the next week to ensure you are healing properly. If symptoms change or worsen, please return to ER.

## 2023-05-25 NOTE — ED Provider Notes (Signed)
 [place] EMERGENCY DEPARTMENT AT [facility] Provider Note   CSN: [id number] Arrival date & time: 05/25/23  4098     History  Chief Complaint  Patient presents with   Hand Pain    Robin Petersen is a 33 y.o. female history of diabetes presented with redness around her right ring finger nailbed for the past few days.  Patient denies any fevers.  Patient states that she has had this in the past and needed this drained.  Patient denies biting her nails, fevers, weakness, paresthesias, drainage.  Home Medications Prior to Admission medications   Medication Sig Start Date End Date Taking? Authorizing Provider  doxycycline (VIBRAMYCIN) 100 MG capsule Take 1 capsule (100 mg total) by mouth 2 (two) times daily for 7 days. 05/25/23 06/01/23 Yes Derrick Orris, Beverly Gust, PA-C  albuterol (VENTOLIN HFA) 108 (90 Base) MCG/ACT inhaler Inhale 1 puff into the lungs every 6 (six) hours as needed for wheezing or shortness of breath.    [provider]  buPROPion (WELLBUTRIN XL) 150 MG 24 hr tablet Take 150 mg by mouth daily.    [provider]  colchicine 0.6 MG tablet When you get this filled take 2 pills at the same time by mouth then wait an hour and take the third pill by mouth 09/21/22   Melene Plan, DO  ferrous sulfate 325 (65 FE) MG EC tablet Take 325 mg by mouth daily with breakfast.    [provider]  fluticasone-salmeterol (ADVAIR) 100-50 MCG/ACT AEPB Inhale 1 puff into the lungs 2 (two) times daily.    [provider]  ibuprofen (ADVIL) 600 MG tablet Take 1 tablet (600 mg total) by mouth every 6 (six) hours as needed for mild pain or moderate pain. 06/04/21   Edwin Dada P, DO  loratadine (CLARITIN) 10 MG tablet Take 10 mg by mouth daily.    [provider]  meclizine (ANTIVERT) 25 MG tablet Take 1 tablet (25 mg total) by mouth 3 (three) times daily as needed for dizziness. 07/22/22   Garlon Hatchet, PA-C  meloxicam (MOBIC) 15 MG tablet Take  15 mg by mouth daily.    [provider]  naproxen (NAPROSYN) 500 MG tablet Take 500 mg by mouth 2 (two) times daily as needed.    [provider]  ondansetron (ZOFRAN-ODT) 4 MG disintegrating tablet Take 1 tablet (4 mg total) by mouth every 8 (eight) hours as needed for nausea. 07/22/22   Garlon Hatchet, PA-C  Vitamin D, Ergocalciferol, (DRISDOL) 1.25 MG (50000 UNIT) CAPS capsule Take 50,000 Units by mouth every 7 (seven) days.    [provider]      Allergies    Patient has no known allergies.    Review of Systems   Review of Systems  Physical Exam Updated Vital Signs BP 112/80   Pulse 82   Temp 97.9 F (36.6 C) (Oral)   Resp 18   SpO2 100%  Physical Exam Constitutional:      General: She is not in acute distress. Cardiovascular:     Rate and Rhythm: Normal rate and regular rhythm.     Pulses: Normal pulses.  Musculoskeletal:        General: Normal range of motion.  Skin:    General: Skin is warm and dry.     Capillary Refill: Capillary refill takes less than 2 seconds.     Comments: Erythema, fluctuance noted proximal to right ring finger nailbed Unable to express out any  purulent material  Neurological:     Mental Status: She is alert.     Comments: Sensation intact distally     ED Results / Procedures / Treatments   Labs (all labs ordered are listed, but only abnormal results are displayed) Labs Reviewed - No data to display  EKG None  Radiology No results found.  Procedures .Incision and Drainage  Date/Time: 05/25/2023 4:36 AM  Performed by: Netta Corrigan, PA-C Authorized by: Netta Corrigan, PA-C   Consent:    Consent obtained:  Verbal   Consent given by:  Patient   Risks, benefits, and alternatives were discussed: yes     Risks discussed:  Bleeding, damage to other organs, infection, incomplete drainage and pain Universal protocol:    Procedure explained and questions answered to patient or proxy's satisfaction: yes      Immediately prior to procedure, a time out was called: yes     Patient identity confirmed:  Verbally with patient Location:    Type:  Abscess   Size:  1.0   Location:  Upper extremity   Upper extremity location:  Finger   Finger location:  R ring finger Pre-procedure details:    Skin preparation:  Chlorhexidine with alcohol Sedation:    Sedation type:  None Anesthesia:    Anesthesia method:  Nerve block   Block location:  Base of ring finger   Block needle gauge:  25 G   Block anesthetic:  Lidocaine 1% w/o epi   Block technique:  Digital   Block injection procedure:  Negative aspiration for blood and anatomic landmarks identified   Block outcome:  Anesthesia achieved Procedure type:    Complexity:  Simple Procedure details:    Ultrasound guidance: no     Needle aspiration: no     Incision types:  Single straight   Incision depth:  Dermal   Drainage:  Purulent and bloody   Drainage amount:  Scant   Wound treatment:  Wound left open   Packing materials:  None Post-procedure details:    Procedure completion:  Tolerated     Medications Ordered in ED Medications  doxycycline (VIBRA-TABS) tablet 100 mg (has no administration in time range)  lidocaine (PF) (XYLOCAINE) 1 % injection 5 mL (5 mLs Intradermal Given by Other 05/25/23 0434)    ED Course/ Medical Decision Making/ A&P                                 Medical Decision Making Risk Prescription drug management.   Ian Malkin 33 y.o. presented today for an abscess.  Working DDx that I considered at this time includes, but not limited to, paronychia, abscess, cellulitis, erysipelas, HS, folliculitis, lymphangitis, necrotizing fasciitis/cellulitis, Fournier's, DVT.  R/o DDx: cellulitis, erysipelas, HS, folliculitis, lymphangitis, necrotizing fasciitis/cellulitis, Fournier's, DVT: These are considered less likely due to history of present illness, physical exam, labs/imaging findings  Review of prior  external notes: 05/31/2022 office visit  Unique Tests and My Independent Interpretation: None  Social Determinants of Health: none  Discussion with Independent Historian: None  Discussion of Management of Tests: None  Risk: Medium: Prescription medication use  Risk Stratification Score: None  Plan: On exam patient was in no acute distress with stable vitals.  Patient is neuro vastly intact and has obvious paronychia on exam.  I discussed with the patient the risks and benefit of doing an incision and drainage and patient with full decision-making  capacity verbalized their understanding symptoms and want to proceed with incision and drainage.  I&D was performed and does show purulent material which does indicate antibiotic use.  Will give patient 1 dose of doxycycline here and prescribe the rest and follow-up with his primary care provider.  I discussed with the patient red flag symptoms to be aware of including fevers, worsening of abscess, etc. patient verbalized understanding of this.  I encouraged patient to use heat packs to help with the draining.  Patient was given return precautions. Patient stable for discharge at this time.  Patient verbalized understanding of plan.  This chart was dictated using voice recognition software.  Despite best efforts to proofread,  errors can occur which can change the documentation meaning.         Final Clinical Impression(s) / ED Diagnoses Final diagnoses:  Paronychia of finger of right hand    Rx / DC Orders ED Discharge Orders          Ordered    doxycycline (VIBRAMYCIN) 100 MG capsule  2 times daily        05/25/23 0432              Netta Corrigan, PA-C 05/25/23 0438    Coral Spikes, DO 05/25/23 [phone]

## 2023-05-25 NOTE — ED Triage Notes (Signed)
 Pt has swelling and redness to the R ring finger around the nail bed

## 2023-07-07 ENCOUNTER — Other Ambulatory Visit: Payer: Self-pay

## 2023-07-07 ENCOUNTER — Emergency Department (HOSPITAL_COMMUNITY)
Admission: EM | Admit: 2023-07-07 | Discharge: 2023-07-07 | Disposition: A | Discharge status: Home / Self Care | Attending: Emergency Medicine | Admitting: Emergency Medicine

## 2023-07-07 ENCOUNTER — Emergency Department (HOSPITAL_COMMUNITY)

## 2023-07-07 ENCOUNTER — Encounter (HOSPITAL_COMMUNITY): Payer: Self-pay | Admitting: Emergency Medicine

## 2023-07-07 DIAGNOSIS — X509XXA Other and unspecified overexertion or strenuous movements or postures, initial encounter: Secondary | ICD-10-CM | POA: Diagnosis not present

## 2023-07-07 DIAGNOSIS — E119 Type 2 diabetes mellitus without complications: Secondary | ICD-10-CM | POA: Diagnosis not present

## 2023-07-07 DIAGNOSIS — M25512 Pain in left shoulder: Secondary | ICD-10-CM | POA: Insufficient documentation

## 2023-07-07 DIAGNOSIS — Y92003 Bedroom of unspecified non-institutional (private) residence as the place of occurrence of the external cause: Secondary | ICD-10-CM | POA: Diagnosis not present

## 2023-07-07 MED ORDER — LIDOCAINE 5 % EX PTCH
1.0000 | MEDICATED_PATCH | CUTANEOUS | Status: DC
Start: 2023-07-07 — End: 2023-07-07
  Administered 2023-07-07: 1 via TRANSDERMAL
  Filled 2023-07-07: qty 1

## 2023-07-07 MED ORDER — NAPROXEN 500 MG PO TABS
500.0000 mg | ORAL_TABLET | Freq: Two times a day (BID) | ORAL | 0 refills | Status: DC
Start: 2023-07-07 — End: 2023-11-21

## 2023-07-07 MED ORDER — METHOCARBAMOL 500 MG PO TABS
500.0000 mg | ORAL_TABLET | Freq: Four times a day (QID) | ORAL | 0 refills | Status: DC | PRN
Start: 2023-07-07 — End: 2023-11-21

## 2023-07-07 MED ORDER — KETOROLAC TROMETHAMINE 15 MG/ML IJ SOLN
15.0000 mg | Freq: Once | INTRAMUSCULAR | Status: AC
Start: 2023-07-07 — End: 2023-07-07
  Administered 2023-07-07: 15 mg via INTRAMUSCULAR
  Filled 2023-07-07: qty 1
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
## 2023-07-07 NOTE — ED Notes (Signed)
 Pt to radiology.

## 2023-07-07 NOTE — ED Provider Notes (Signed)
 [facility] AT [facility] Provider Note   CSN: [id number] Arrival date & time: 07/07/23  0945     History  Chief Complaint  Patient presents with   Shoulder Pain    Robin Petersen is a 33 y.o. female.  She has PMH of diabetes and low blood pressure.  Presents ER for left shoulder pain x 4 months, states she rolled over in the bed to try to get comfortable 4 months ago and felt a "pop" felt like it popped out of place but then when she rolled again it fell like it popped back into place.  She having pain with range of motion ever since.  She is able to move it some but to get above her head she is to use her right arm to fully lift it.  She states the pain shoots from the left trapezius area and anterior shoulder all the way to her fingers.  Denies numbness or tingling.  Is not seen anybody for this.  She states she came in today because she was tired of being in pain.  She been taking Tylenol  at home without relief.   Shoulder Pain      Home Medications Prior to Admission medications   Medication Sig Start Date End Date Taking? Authorizing Provider  methocarbamol  (ROBAXIN ) 500 MG tablet Take 1 tablet (500 mg total) by mouth every 6 (six) hours as needed for muscle spasms. 07/07/23  Yes Ianmichael Amescua A, PA-C  naproxen  (NAPROSYN ) 500 MG tablet Take 1 tablet (500 mg total) by mouth 2 (two) times daily. 07/07/23  Yes Willamae Demby A, PA-C  albuterol  (VENTOLIN  HFA) 108 (90 Base) MCG/ACT inhaler Inhale 1 puff into the lungs every 6 (six) hours as needed for wheezing or shortness of breath.    [provider]  buPROPion (WELLBUTRIN XL) 150 MG 24 hr tablet Take 150 mg by mouth daily.    [provider]  colchicine  0.6 MG tablet When you get this filled take 2 pills at the same time by mouth then wait an hour and take the third pill by mouth 09/21/22   Albertus Hughs, DO  ferrous sulfate 325 (65 FE) MG EC tablet Take 325 mg by mouth daily with  breakfast.    [provider]  fluticasone-salmeterol (ADVAIR) 100-50 MCG/ACT AEPB Inhale 1 puff into the lungs 2 (two) times daily.    [provider]  ibuprofen  (ADVIL ) 600 MG tablet Take 1 tablet (600 mg total) by mouth every 6 (six) hours as needed for mild pain or moderate pain. 06/04/21   Gray, Alicia P, DO  loratadine (CLARITIN) 10 MG tablet Take 10 mg by mouth daily.    [provider]  meclizine  (ANTIVERT ) 25 MG tablet Take 1 tablet (25 mg total) by mouth 3 (three) times daily as needed for dizziness. 07/22/22   Coretha Dew, PA-C  meloxicam (MOBIC) 15 MG tablet Take 15 mg by mouth daily.    [provider]  ondansetron  (ZOFRAN -ODT) 4 MG disintegrating tablet Take 1 tablet (4 mg total) by mouth every 8 (eight) hours as needed for nausea. 07/22/22   Coretha Dew, PA-C  Vitamin D, Ergocalciferol, (DRISDOL) 1.25 MG (50000 UNIT) CAPS capsule Take 50,000 Units by mouth every 7 (seven) days.    [provider]      Allergies    Patient has no known allergies.    Review of Systems   Review of Systems  Physical Exam Updated Vital  Signs BP 112/64 (BP Location: Right Arm)   Pulse 63   Temp 97.9 F (36.6 C)   Resp 18   Ht 5\' 7"  (1.702 m)   Wt 93 kg   LMP 07/01/2023   SpO2 100%   BMI 32.11 kg/m  Physical Exam Vitals and nursing note reviewed.  Constitutional:      General: She is not in acute distress.    Appearance: She is well-developed.  HENT:     Head: Normocephalic and atraumatic.  Eyes:     Conjunctiva/sclera: Conjunctivae normal.  Cardiovascular:     Rate and Rhythm: Normal rate and regular rhythm.     Heart sounds: No murmur heard. Pulmonary:     Effort: Pulmonary effort is normal. No respiratory distress.     Breath sounds: Normal breath sounds.  Abdominal:     Palpations: Abdomen is soft.     Tenderness: There is no abdominal tenderness.  Musculoskeletal:        General: No swelling or deformity.     Cervical  back: Normal range of motion and neck supple. No tenderness.     Comments: Tenderness to left trapezius and left AC joint and anterior left shoulder, no crepitus.  Full passive range of motion, active range of motion somewhat limited due to pain.  Radial pulse in the left wrist is 2+.  Sensation intact to entirety of left hand.  Patient can make thumbs up, okay, flex and extend her wrist but states any movement of the arm causes pain in the shoulder.  Skin:    General: Skin is warm and dry.     Capillary Refill: Capillary refill takes less than 2 seconds.  Neurological:     General: No focal deficit present.     Mental Status: She is alert and oriented to person, place, and time.  Psychiatric:        Mood and Affect: Mood normal.     ED Results / Procedures / Treatments   Labs (all labs ordered are listed, but only abnormal results are displayed) Labs Reviewed - No data to display  EKG None  Radiology DG Shoulder Left Result Date: 07/07/2023 CLINICAL DATA:  Left shoulder pain. EXAM: LEFT SHOULDER - 3 VIEW COMPARISON:  Left shoulder radiographs 02/06/2023. FINDINGS: There is no evidence of fracture or dislocation. There is no evidence of arthropathy or other focal bone abnormality. Soft tissues are unremarkable. IMPRESSION: Negative left shoulder radiographs. Electronically Signed   By: Audree Leas M.D.   On: 07/07/2023 10:45    Procedures Procedures    Medications Ordered in ED Medications  lidocaine  (LIDODERM ) 5 % 1 patch (1 patch Transdermal Patch Applied 07/07/23 1125)  ketorolac  (TORADOL ) 15 MG/ML injection 15 mg (15 mg Intramuscular Given 07/07/23 1125)    ED Course/ Medical Decision Making/ A&P                                 Medical Decision Making This patient presents to the ED for concern of left shoulder pain X 4 months, this involves an extensive number of treatment options, and is a complaint that carries with it a high risk of complications and morbidity.   The differential diagnosis includes pain, strain, contusion, dislocation, arthritis, septic arthritis, other   Co morbidities that complicate the patient evaluation  DM type II   Additional history obtained:  Additional history obtained from EMR External records from outside source obtained  and reviewed including prior notes      Imaging Studies ordered:  I ordered imaging studies including x-ray left shoulder I independently visualized and interpreted imaging which showed no fracture, no malalignment I agree with the radiologist interpretation     Problem List / ED Course / Critical interventions / Medication management  Left shoulder pain x 4 months after patient felt a pop rolling out of bed and thinks she dislocated and relocated at that night.  She does not have any interval injury.  She states she had no change, she is tired of dealing with the pain.  She has been taking Tylenol  without relief, will treat with NSAIDs rest and Ortho follow-up.  She has no swelling, no redness or warmth, no fevers or chills to suggest this is infectious, so they are not septic arthritis.  Likely soft tissue injury.  Advised on sling as needed for comfort, shoulder range of motion exercises and follow-up with orthopedics.  She states she plans to follow-up so she can get referred to physical therapy.  She has no neurovascular compromise.  I have reviewed the patients home medicines and have made adjustments as needed     Amount and/or Complexity of Data Reviewed Radiology: ordered.  Risk Prescription drug management.           Final Clinical Impression(s) / ED Diagnoses Final diagnoses:  Left shoulder pain, unspecified chronicity    Rx / DC Orders ED Discharge Orders          Ordered    methocarbamol  (ROBAXIN ) 500 MG tablet  Every 6 hours PRN        07/07/23 1117    naproxen  (NAPROSYN ) 500 MG tablet  2 times daily        07/07/23 [address] 07/07/23 1158    Ninetta Basket, MD 07/08/23 1000

## 2023-07-07 NOTE — Discharge Instructions (Signed)
 You are seen in the emergency room today for evaluation of left shoulder pain for the past 4 months after feeling it pop in and out when rolling over.  Fortunately your x-rays are normal, we are prescribing anti-inflammatories and lidocaine  patches, you can use the sling for rest as needed, perform daily range of motion exercises and follow-up with orthopedic.

## 2023-08-08 ENCOUNTER — Emergency Department (HOSPITAL_COMMUNITY)
Admission: EM | Admit: 2023-08-08 | Discharge: 2023-08-08 | Disposition: A | Discharge status: Home / Self Care | Attending: Emergency Medicine | Admitting: Emergency Medicine

## 2023-08-08 ENCOUNTER — Other Ambulatory Visit: Payer: Self-pay

## 2023-08-08 ENCOUNTER — Encounter (HOSPITAL_COMMUNITY): Payer: Self-pay

## 2023-08-08 ENCOUNTER — Emergency Department (HOSPITAL_COMMUNITY)

## 2023-08-08 DIAGNOSIS — M25562 Pain in left knee: Secondary | ICD-10-CM | POA: Diagnosis not present

## 2023-08-08 MED ORDER — IBUPROFEN 600 MG PO TABS: 600.0000 mg | ORAL_TABLET | Freq: Four times a day (QID) | ORAL | 0 refills | Status: DC | PRN

## 2023-08-08 MED ORDER — OXYCODONE-ACETAMINOPHEN 5-325 MG PO TABS
1.0000 | ORAL_TABLET | Freq: Once | ORAL | Status: AC
Start: 2023-08-08 — End: 2023-08-08
  Administered 2023-08-08: 1 via ORAL
  Filled 2023-08-08: qty 1

## 2023-08-08 NOTE — ED Notes (Signed)
Ortho tech bedside 

## 2023-08-08 NOTE — ED Notes (Signed)
 Ortho tech called

## 2023-08-08 NOTE — ED Provider Notes (Signed)
 [facility] Provider Note   CSN: [id number] Arrival date & time: 08/08/23  4098     History  Chief Complaint  Patient presents with   Knee Pain    Robin Petersen is a 33 y.o. female.  Patient presents to the emergency department for evaluation of knee problems.  Patient reports pain and locking up of the left knee when she walks for the last 3 days.  She has had chronic problems with this knee and was told that she had a meniscus problem.  No recent injury.  She reports pain is worse when bearing weight.       Home Medications Prior to Admission medications   Medication Sig Start Date End Date Taking? Authorizing Provider  ibuprofen  (ADVIL ) 600 MG tablet Take 1 tablet (600 mg total) by mouth every 6 (six) hours as needed. 08/08/23  Yes Amare Kontos, Marine Sia, MD  albuterol  (VENTOLIN  HFA) 108 (90 Base) MCG/ACT inhaler Inhale 1 puff into the lungs every 6 (six) hours as needed for wheezing or shortness of breath.    [provider]  buPROPion (WELLBUTRIN XL) 150 MG 24 hr tablet Take 150 mg by mouth daily.    [provider]  colchicine  0.6 MG tablet When you get this filled take 2 pills at the same time by mouth then wait an hour and take the third pill by mouth 09/21/22   Albertus Hughs, DO  ferrous sulfate 325 (65 FE) MG EC tablet Take 325 mg by mouth daily with breakfast.    [provider]  fluticasone-salmeterol (ADVAIR) 100-50 MCG/ACT AEPB Inhale 1 puff into the lungs 2 (two) times daily.    [provider]  loratadine (CLARITIN) 10 MG tablet Take 10 mg by mouth daily.    [provider]  meclizine  (ANTIVERT ) 25 MG tablet Take 1 tablet (25 mg total) by mouth 3 (three) times daily as needed for dizziness. 07/22/22   Coretha Dew, PA-C  meloxicam (MOBIC) 15 MG tablet Take 15 mg by mouth daily.    [provider]  methocarbamol  (ROBAXIN ) 500 MG tablet Take 1 tablet (500 mg total) by  mouth every 6 (six) hours as needed for muscle spasms. 07/07/23   Baxter Limber A, PA-C  naproxen  (NAPROSYN ) 500 MG tablet Take 1 tablet (500 mg total) by mouth 2 (two) times daily. 07/07/23   Baxter Limber A, PA-C  ondansetron  (ZOFRAN -ODT) 4 MG disintegrating tablet Take 1 tablet (4 mg total) by mouth every 8 (eight) hours as needed for nausea. 07/22/22   Coretha Dew, PA-C  Vitamin D, Ergocalciferol, (DRISDOL) 1.25 MG (50000 UNIT) CAPS capsule Take 50,000 Units by mouth every 7 (seven) days.    [provider]      Allergies    Patient has no known allergies.    Review of Systems   Review of Systems  Physical Exam Updated Vital Signs BP 108/73 (BP Location: Right Arm)   Pulse 79   Temp 97.7 F (36.5 C) (Oral)   Resp 19   Ht 5\' 7"  (1.702 m)   Wt 93 kg   SpO2 100%   BMI 32.11 kg/m  Physical Exam Vitals and nursing note reviewed.  Constitutional:      General: She is not in acute distress.    Appearance: She is well-developed.  HENT:     Head: Normocephalic and atraumatic.     Mouth/Throat:     Mouth: Mucous membranes are moist.  Eyes:     General: Vision grossly intact. Gaze aligned appropriately.     Extraocular Movements: Extraocular movements intact.     Conjunctiva/sclera: Conjunctivae normal.  Cardiovascular:     Rate and Rhythm: Normal rate and regular rhythm.     Pulses: Normal pulses.     Heart sounds: Normal heart sounds, S1 normal and S2 normal. No murmur heard.    No friction rub. No gallop.  Pulmonary:     Effort: Pulmonary effort is normal. No respiratory distress.     Breath sounds: Normal breath sounds.  Abdominal:     General: Bowel sounds are normal.     Palpations: Abdomen is soft.     Tenderness: There is no abdominal tenderness. There is no guarding or rebound.     Hernia: No hernia is present.  Musculoskeletal:        General: No swelling.     Cervical back: Full passive range of motion without pain, normal range of motion and neck  supple. No spinous process tenderness or muscular tenderness. Normal range of motion.     Left knee: No swelling, deformity, effusion, erythema or ecchymosis. Decreased range of motion. Tenderness present.     Right lower leg: No edema.     Left lower leg: No edema.  Skin:    General: Skin is warm and dry.     Capillary Refill: Capillary refill takes less than 2 seconds.     Findings: No ecchymosis, erythema, rash or wound.  Neurological:     General: No focal deficit present.     Mental Status: She is alert and oriented to person, place, and time.     GCS: GCS eye subscore is 4. GCS verbal subscore is 5. GCS motor subscore is 6.     Cranial Nerves: Cranial nerves 2-12 are intact.     Sensory: Sensation is intact.     Motor: Motor function is intact.     Coordination: Coordination is intact.  Psychiatric:        Attention and Perception: Attention normal.        Mood and Affect: Mood normal.        Speech: Speech normal.        Behavior: Behavior normal.     ED Results / Procedures / Treatments   Labs (all labs ordered are listed, but only abnormal results are displayed) Labs Reviewed - No data to display  EKG None  Radiology DG Knee Complete 4 Views Left Result Date: 08/08/2023 CLINICAL DATA:  knee pain.  Meniscus tissue. EXAM: LEFT KNEE - COMPLETE 4+ VIEW COMPARISON:  None Available. FINDINGS: No evidence of fracture, dislocation, or joint effusion. No evidence of arthropathy or other focal bone abnormality. Diffuse subcutaneus soft tissue edema. IMPRESSION: Negative. Electronically Signed   By: Morgane  Naveau M.D.   On: 08/08/2023 00:52    Procedures Procedures    Medications Ordered in ED Medications  oxyCODONE -acetaminophen  (PERCOCET/ROXICET) 5-325 MG per tablet 1 tablet (1 tablet Oral Given 08/08/23 0116)    ED Course/ Medical Decision Making/ A&P                                 Medical Decision Making Amount and/or Complexity of Data Reviewed Radiology:  ordered.  Risk Prescription drug management.   XRAY neg. No sign septic arthritis.        Final Clinical Impression(s) / ED Diagnoses Final diagnoses:  Acute  pain of left knee    Rx / DC Orders ED Discharge Orders          Ordered    ibuprofen  (ADVIL ) 600 MG tablet  Every 6 hours PRN        08/08/23 0103              Ballard Bongo, MD 08/08/23 [phone]

## 2023-08-08 NOTE — ED Triage Notes (Signed)
 Pt BIB [facility] d/t left knee pain for 3 days - pt has known Meniscus issue.

## 2023-08-08 NOTE — Progress Notes (Signed)
 Orthopedic Tech Progress Note Patient Details:  Ruhani Umland 12/19/1990 [id number]  Ortho Devices Type of Ortho Device: Knee Immobilizer, Crutches Ortho Device/Splint Location: LLE Ortho Device/Splint Interventions: Ordered, Application, Adjustment   Post Interventions Patient Tolerated: Well Instructions Provided: Care of device, Poper ambulation with device New crutches given to patient, as they state the pair they received a few months ago have been lost. Toi Foster 08/08/2023, 1:32 AM

## 2023-08-08 NOTE — Discharge Instructions (Addendum)
 Please schedule follow up with orthopedics for further treatment of your knee problems.

## 2023-08-17 DIAGNOSIS — F4312 Post-traumatic stress disorder, chronic: Secondary | ICD-10-CM | POA: Diagnosis not present

## 2023-08-24 ENCOUNTER — Emergency Department (HOSPITAL_COMMUNITY): Admission: EM | Admit: 2023-08-24 | Discharge: 2023-08-24 | Disposition: A | Discharge status: Home / Self Care

## 2023-08-24 ENCOUNTER — Other Ambulatory Visit: Payer: Self-pay

## 2023-08-24 ENCOUNTER — Encounter (HOSPITAL_COMMUNITY): Payer: Self-pay

## 2023-08-24 ENCOUNTER — Emergency Department (HOSPITAL_COMMUNITY)

## 2023-08-24 DIAGNOSIS — M25561 Pain in right knee: Secondary | ICD-10-CM | POA: Diagnosis not present

## 2023-08-24 DIAGNOSIS — S0993XA Unspecified injury of face, initial encounter: Secondary | ICD-10-CM | POA: Diagnosis not present

## 2023-08-24 DIAGNOSIS — S0990XA Unspecified injury of head, initial encounter: Secondary | ICD-10-CM

## 2023-08-24 DIAGNOSIS — S80919A Unspecified superficial injury of unspecified knee, initial encounter: Secondary | ICD-10-CM | POA: Diagnosis not present

## 2023-08-24 DIAGNOSIS — Z79899 Other long term (current) drug therapy: Secondary | ICD-10-CM | POA: Insufficient documentation

## 2023-08-24 DIAGNOSIS — Z23 Encounter for immunization: Secondary | ICD-10-CM | POA: Diagnosis not present

## 2023-08-24 DIAGNOSIS — R519 Headache, unspecified: Secondary | ICD-10-CM | POA: Diagnosis present

## 2023-08-24 DIAGNOSIS — W01198A Fall on same level from slipping, tripping and stumbling with subsequent striking against other object, initial encounter: Secondary | ICD-10-CM | POA: Insufficient documentation

## 2023-08-24 DIAGNOSIS — E119 Type 2 diabetes mellitus without complications: Secondary | ICD-10-CM | POA: Diagnosis not present

## 2023-08-24 DIAGNOSIS — I10 Essential (primary) hypertension: Secondary | ICD-10-CM | POA: Diagnosis not present

## 2023-08-24 DIAGNOSIS — S069X9A Unspecified intracranial injury with loss of consciousness of unspecified duration, initial encounter: Secondary | ICD-10-CM | POA: Insufficient documentation

## 2023-08-24 DIAGNOSIS — W19XXXA Unspecified fall, initial encounter: Secondary | ICD-10-CM | POA: Diagnosis not present

## 2023-08-24 DIAGNOSIS — R220 Localized swelling, mass and lump, head: Secondary | ICD-10-CM | POA: Diagnosis not present

## 2023-08-24 DIAGNOSIS — S199XXA Unspecified injury of neck, initial encounter: Secondary | ICD-10-CM | POA: Diagnosis not present

## 2023-08-24 MED ORDER — TETANUS-DIPHTH-ACELL PERTUSSIS 5-2.5-18.5 LF-MCG/0.5 IM SUSY
0.5000 mL | PREFILLED_SYRINGE | Freq: Once | INTRAMUSCULAR | Status: AC
  Administered 2023-08-24: 0.5 mL via INTRAMUSCULAR
  Filled 2023-08-24: qty 0.5

## 2023-08-24 MED ORDER — ACETAMINOPHEN 500 MG PO TABS
1000.0000 mg | ORAL_TABLET | Freq: Once | ORAL | Status: AC
  Administered 2023-08-24: 1000 mg via ORAL
  Filled 2023-08-24: qty 2

## 2023-08-24 NOTE — ED Provider Notes (Signed)
  EMERGENCY DEPARTMENT AT [facility] Provider Note   CSN: [id number] Arrival date & time: 08/24/23  7829     Patient presents with: Robin Petersen Robin Petersen is a 33 y.o. female.   33 year old female with past medical history of diabetes and hypertension in the past presenting to the emergency department today with headache after she tripped and fell this morning.  The patient states she tripped and fell and hit her head.  She did lose consciousness.  She scraped her knee as well.  This was not a syncopal episode.  She denies any chest pain or shortness of breath.  Denies any abdominal pain.  Denies any significant pain in her knee.  She states that she did lose consciousness briefly after hitting her head.  Reports she is having a global, throbbing headache since.  She denies any focal weakness, numbness, or tingling.   Fall Associated symptoms include headaches.       Prior to Admission medications   Medication Sig Start Date End Date Taking? Authorizing Provider  albuterol  (VENTOLIN  HFA) 108 (90 Base) MCG/ACT inhaler Inhale 1 puff into the lungs every 6 (six) hours as needed for wheezing or shortness of breath.    [provider]  buPROPion (WELLBUTRIN XL) 150 MG 24 hr tablet Take 150 mg by mouth daily.    [provider]  colchicine  0.6 MG tablet When you get this filled take 2 pills at the same time by mouth then wait an hour and take the third pill by mouth 09/21/22   Albertus Hughs, DO  ferrous sulfate 325 (65 FE) MG EC tablet Take 325 mg by mouth daily with breakfast.    [provider]  fluticasone-salmeterol (ADVAIR) 100-50 MCG/ACT AEPB Inhale 1 puff into the lungs 2 (two) times daily.    [provider]  ibuprofen  (ADVIL ) 600 MG tablet Take 1 tablet (600 mg total) by mouth every 6 (six) hours as needed. 08/08/23   Ballard Bongo, MD  loratadine (CLARITIN) 10 MG tablet Take 10 mg by mouth daily.    [provider]  meclizine  (ANTIVERT ) 25 MG tablet Take 1 tablet (25 mg total) by mouth 3 (three) times daily as needed for dizziness. 07/22/22   Coretha Dew, PA-C  meloxicam (MOBIC) 15 MG tablet Take 15 mg by mouth daily.    [provider]  methocarbamol  (ROBAXIN ) 500 MG tablet Take 1 tablet (500 mg total) by mouth every 6 (six) hours as needed for muscle spasms. 07/07/23   Baxter Limber A, PA-C  naproxen  (NAPROSYN ) 500 MG tablet Take 1 tablet (500 mg total) by mouth 2 (two) times daily. 07/07/23   Baxter Limber A, PA-C  ondansetron  (ZOFRAN -ODT) 4 MG disintegrating tablet Take 1 tablet (4 mg total) by mouth every 8 (eight) hours as needed for nausea. 07/22/22   Coretha Dew, PA-C  Vitamin D, Ergocalciferol, (DRISDOL) 1.25 MG (50000 UNIT) CAPS capsule Take 50,000 Units by mouth every 7 (seven) days.    [provider]    Allergies: Patient has no known allergies.    Review of Systems  Neurological:  Positive for headaches.  All other systems reviewed and are negative.   Updated Vital Signs BP 101/66 (BP Location: Left Arm)   Pulse 74   Temp (!) 97.5 F (36.4 C) (Oral)   Resp 19   SpO2 100%   Physical Exam Vitals and nursing note reviewed.   Gen: NAD Eyes: PERRL, EOMI HEENT:  no oropharyngeal swelling Neck: trachea midline, c-collar in place, left-sided paraspinal tenderness noted Resp: clear to auscultation bilaterally Card: RRR, no murmurs, rubs, or gallops Abd: nontender, nondistended Extremities: no calf tenderness, no edema MSK: No thoracic or lumbar spinal tenderness noted, abrasion noted over the right knee, no bony tenderness noted, the extremities are atraumatic Neuro: Somewhat sonorous but easily arousable to verbal stimuli, no focal deficits Vascular: 2+ radial pulses bilaterally, 2+ DP pulses bilaterally Skin: no rashes Psyc: acting appropriately   (all labs ordered are listed, but only abnormal results are displayed) Labs Reviewed - No  data to display  EKG: None  Radiology: CT Head Wo Contrast Result Date: 08/24/2023 CLINICAL DATA:  Head trauma, neck trauma. EXAM: CT HEAD WITHOUT CONTRAST CT CERVICAL SPINE WITHOUT CONTRAST TECHNIQUE: Multidetector CT imaging of the head and cervical spine was performed following the standard protocol without intravenous contrast. Multiplanar CT image reconstructions of the cervical spine were also generated. RADIATION DOSE REDUCTION: This exam was performed according to the departmental dose-optimization program which includes automated exposure control, adjustment of the mA and/or kV according to patient size and/or use of iterative reconstruction technique. COMPARISON:  CT head 04/11/2020, CT cervical spine 05/07/2019. FINDINGS: CT HEAD FINDINGS Brain: No acute intracranial hemorrhage. No CT evidence of acute infarct. No edema, mass effect, or midline shift. The basilar cisterns are patent. Ventricles: The ventricles are normal. Vascular: No hyperdense vessel or unexpected calcification. Skull: No acute or aggressive finding. 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  Left orbit is unremarkable. Sinuses: The visualized paranasal sinuses are clear. Other: Mastoid air cells are clear. Mild soft tissue swelling along the superolateral aspect of the left orbit. CT CERVICAL SPINE FINDINGS Alignment: Normal. Skull base and vertebrae: No acute fracture. No primary bone lesion or focal pathologic process. Soft tissues and spinal canal: No prevertebral fluid or swelling. No visible canal hematoma. Disc levels: Intervertebral disc spaces are maintained. No high-grade osseous spinal canal stenosis. No high-grade osseous foraminal stenosis. Upper chest: Negative. Other: None. IMPRESSION: No CT evidence of acute intracranial abnormality. No acute fracture or traumatic malalignment of the cervical spine. Mild soft tissue swelling in the subcutaneous tissues along the superolateral aspect of the left orbit. 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 Electronically Signed   By: Denny Flack M.D.   On: 08/24/2023 08:35   CT Cervical Spine Wo Contrast Result Date: 08/24/2023 CLINICAL DATA:  Head trauma, neck trauma. EXAM: CT HEAD WITHOUT CONTRAST CT CERVICAL SPINE WITHOUT CONTRAST TECHNIQUE: Multidetector CT imaging of the head and cervical spine was performed following the standard protocol without intravenous contrast. Multiplanar CT image reconstructions of the cervical spine were also generated. RADIATION DOSE REDUCTION: This exam was performed according to the departmental dose-optimization program which includes automated exposure control, adjustment of the mA and/or kV according to patient size and/or use of iterative reconstruction technique. COMPARISON:  CT head 04/11/2020, CT cervical spine 05/07/2019. FINDINGS: CT HEAD FINDINGS Brain: No acute intracranial hemorrhage. No CT evidence of acute infarct. No edema, mass effect, or midline shift. The basilar cisterns are patent. Ventricles: The ventricles are normal. Vascular: No hyperdense vessel or unexpected calcification. Skull: No acute or aggressive finding. 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  Left orbit is unremarkable. Sinuses: The visualized paranasal sinuses are clear. Other: Mastoid air cells are clear. Mild soft tissue swelling along the superolateral aspect of the left orbit. CT CERVICAL SPINE FINDINGS Alignment: Normal. Skull base and vertebrae: No acute fracture. No primary bone lesion or  focal pathologic process. Soft tissues and spinal canal: No prevertebral fluid or swelling. No visible canal hematoma. Disc levels: Intervertebral disc spaces are maintained. No high-grade osseous spinal canal stenosis. No high-grade osseous foraminal stenosis. Upper chest: Negative. Other: None. IMPRESSION: No CT evidence of acute intracranial abnormality. No acute fracture or traumatic malalignment of the cervical spine. Mild soft tissue swelling in the subcutaneous tissues  along the superolateral aspect of the left orbit. 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 Electronically Signed   By: Denny Flack M.D.   On: 08/24/2023 08:35     Procedures   Medications Ordered in the ED  acetaminophen  (TYLENOL ) tablet 1,000 mg (1,000 mg Oral Given 08/24/23 0828)  Tdap (BOOSTRIX) injection 0.5 mL (0.5 mLs Intramuscular Given 08/24/23 1478)                                    Medical Decision Making 33 year old female with past medical history of diabetes presenting to the emergency department today with headache and loss of consciousness after a mechanical fall.  Will further evaluate her here with a CT scan of her head and cervical spine for further evaluation for acute traumatic injuries.  I will give the patient Tylenol  for headache.  Will update her tetanus.  I will reevaluate for ultimate disposition.  This was not a syncopal episode so do not think that labs are warranted at this time.  The patient CT scans were negative.  She is discharged with return precautions.  Amount and/or Complexity of Data Reviewed Radiology: ordered.  Risk OTC drugs. Prescription drug management.        Final diagnoses:  Closed head injury, initial encounter    ED Discharge Orders     None          Carin Charleston, MD 08/24/23 [phone]

## 2023-08-24 NOTE — ED Triage Notes (Addendum)
 PT BIB EMS for a mechanical fall, tripped over her shoe. She hit the left side of her head, abrasion to left forehead and right knee.  NO Loss of consciousness, no neck or back pain.   108/78 Hr 80 99% RA Resp 18 CBG 116

## 2023-08-24 NOTE — Discharge Instructions (Addendum)
 Your CT scans did not show any concerning acute findings.  Please follow-up with your doctor.  Take Tylenol  or ibuprofen  as needed for pain or headache.  Return to the emergency department for worsening symptoms.

## 2023-09-16 ENCOUNTER — Encounter (HOSPITAL_COMMUNITY): Payer: Self-pay

## 2023-09-16 ENCOUNTER — Emergency Department (HOSPITAL_COMMUNITY)
Admission: EM | Admit: 2023-09-16 | Discharge: 2023-09-16 | Disposition: A | Discharge status: Home / Self Care | Attending: Student | Admitting: Student

## 2023-09-16 ENCOUNTER — Other Ambulatory Visit: Payer: Self-pay

## 2023-09-16 DIAGNOSIS — K0501 Acute gingivitis, non-plaque induced: Secondary | ICD-10-CM | POA: Diagnosis not present

## 2023-09-16 DIAGNOSIS — E119 Type 2 diabetes mellitus without complications: Secondary | ICD-10-CM | POA: Diagnosis not present

## 2023-09-16 DIAGNOSIS — G501 Atypical facial pain: Secondary | ICD-10-CM | POA: Diagnosis not present

## 2023-09-16 DIAGNOSIS — K029 Dental caries, unspecified: Secondary | ICD-10-CM | POA: Diagnosis not present

## 2023-09-16 DIAGNOSIS — A691 Other Vincent's infections: Secondary | ICD-10-CM | POA: Insufficient documentation

## 2023-09-16 DIAGNOSIS — Z87891 Personal history of nicotine dependence: Secondary | ICD-10-CM | POA: Diagnosis not present

## 2023-09-16 DIAGNOSIS — K1379 Other lesions of oral mucosa: Secondary | ICD-10-CM | POA: Diagnosis present

## 2023-09-16 DIAGNOSIS — Z5900 Homelessness unspecified: Secondary | ICD-10-CM | POA: Diagnosis not present

## 2023-09-16 MED ORDER — METRONIDAZOLE 500 MG PO TABS: 500.0000 mg | ORAL_TABLET | Freq: Three times a day (TID) | ORAL | 0 refills | Status: AC

## 2023-09-16 MED ORDER — METRONIDAZOLE 500 MG PO TABS: 500.0000 mg | ORAL_TABLET | Freq: Two times a day (BID) | ORAL | 0 refills | Status: DC

## 2023-09-16 MED ORDER — AMOXICILLIN-POT CLAVULANATE 875-125 MG PO TABS
1.0000 | ORAL_TABLET | Freq: Once | ORAL | Status: AC
  Administered 2023-09-16: 1 via ORAL
  Filled 2023-09-16: qty 1

## 2023-09-16 MED ORDER — KETOROLAC TROMETHAMINE 15 MG/ML IJ SOLN
15.0000 mg | Freq: Once | INTRAMUSCULAR | Status: AC
  Administered 2023-09-16: 15 mg via INTRAMUSCULAR
  Filled 2023-09-16: qty 1

## 2023-09-16 MED ORDER — AMOXICILLIN-POT CLAVULANATE 875-125 MG PO TABS: 1.0000 | ORAL_TABLET | Freq: Two times a day (BID) | ORAL | 0 refills | Status: DC

## 2023-09-16 MED ORDER — METRONIDAZOLE 500 MG PO TABS
500.0000 mg | ORAL_TABLET | Freq: Once | ORAL | Status: AC
  Administered 2023-09-16: 500 mg via ORAL
  Filled 2023-09-16: qty 1

## 2023-09-16 NOTE — ED Triage Notes (Signed)
 Patient BIB EMS with complaint of mouth sores.

## 2023-09-16 NOTE — ED Provider Notes (Signed)
 [place] EMERGENCY DEPARTMENT AT [facility] Provider Note  CSN: [id number] Arrival date & time: 09/16/23 9491  Chief Complaint(s) Mouth Lesions  HPI Robin Petersen is a 33 y.o. female with PMH T2DM, homelessness who presents emergency room for evaluation of mouth lesions.  States that symptoms have been worsening over the last 72 hours.  Denies trauma to the mouth.  Denies chest pain, shortness of breath, headache, fever or other systemic symptoms.   Past Medical History Past Medical History:  Diagnosis Date   Diabetes mellitus without complication (HCC)    Hypotension    Patient Active Problem List   Diagnosis Date Noted   Left arm pain 02/21/2022   Home Medication(s) Prior to Admission medications   Medication Sig Start Date End Date Taking? Authorizing Provider  amoxicillin -clavulanate (AUGMENTIN ) 875-125 MG tablet Take 1 tablet by mouth every 12 (twelve) hours. 09/16/23  Yes Kamaya Keckler, MD  albuterol  (VENTOLIN  HFA) 108 (90 Base) MCG/ACT inhaler Inhale 1 puff into the lungs every 6 (six) hours as needed for wheezing or shortness of breath.    [provider]  buPROPion (WELLBUTRIN XL) 150 MG 24 hr tablet Take 150 mg by mouth daily.    [provider]  colchicine  0.6 MG tablet When you get this filled take 2 pills at the same time by mouth then wait an hour and take the third pill by mouth 09/21/22   Emil Share, DO  ferrous sulfate 325 (65 FE) MG EC tablet Take 325 mg by mouth daily with breakfast.    [provider]  fluticasone-salmeterol (ADVAIR) 100-50 MCG/ACT AEPB Inhale 1 puff into the lungs 2 (two) times daily.    [provider]  ibuprofen  (ADVIL ) 600 MG tablet Take 1 tablet (600 mg total) by mouth every 6 (six) hours as needed. 08/08/23   Haze Lonni PARAS, MD  loratadine (CLARITIN) 10 MG tablet Take 10 mg by mouth daily.    [provider]  meclizine  (ANTIVERT ) 25 MG tablet Take 1 tablet (25 mg total) by  mouth 3 (three) times daily as needed for dizziness. 07/22/22   Jarold Olam HERO, PA-C  meloxicam (MOBIC) 15 MG tablet Take 15 mg by mouth daily.    [provider]  methocarbamol  (ROBAXIN ) 500 MG tablet Take 1 tablet (500 mg total) by mouth every 6 (six) hours as needed for muscle spasms. 07/07/23   Suellen Cantor A, PA-C  metroNIDAZOLE  (FLAGYL ) 500 MG tablet Take 1 tablet (500 mg total) by mouth 3 (three) times daily for 7 days. 09/16/23 09/23/23  Cleon Thoma, MD  naproxen  (NAPROSYN ) 500 MG tablet Take 1 tablet (500 mg total) by mouth 2 (two) times daily. 07/07/23   Suellen Cantor A, PA-C  ondansetron  (ZOFRAN -ODT) 4 MG disintegrating tablet Take 1 tablet (4 mg total) by mouth every 8 (eight) hours as needed for nausea. 07/22/22   Jarold Olam HERO, PA-C  Vitamin D, Ergocalciferol, (DRISDOL) 1.25 MG (50000 UNIT) CAPS capsule Take 50,000 Units by mouth every 7 (seven) days.    [provider]  Past Surgical History Past Surgical History:  Procedure Laterality Date   CESAREAN SECTION     Family History History reviewed. No pertinent family history.  Social History Social History   Tobacco Use   Smoking status: Former    Current packs/day: 0.25    Average packs/day: 0.3 packs/day for 9.0 years (2.3 ttl pk-yrs)    Types: Cigarettes   Smokeless tobacco: Never  Vaping Use   Vaping status: Never Used  Substance Use Topics   Alcohol use: Never   Drug use: Not Currently    Types: Marijuana   Allergies Patient has no known allergies.  Review of Systems Review of Systems  HENT:  Positive for dental problem and mouth sores.     Physical Exam Vital Signs  I have reviewed the triage vital signs BP 122/86   Pulse 100   Temp 98.5 F (36.9 C) (Oral)   Resp 16   SpO2 97%   Physical Exam Vitals and nursing note reviewed.  Constitutional:       General: She is not in acute distress.    Appearance: She is well-developed.  HENT:     Head: Normocephalic and atraumatic.     Mouth/Throat:     Comments: Multiple dental caries, purulent gingiva, ulceration of the interior lower lip Eyes:     Conjunctiva/sclera: Conjunctivae normal.  Cardiovascular:     Rate and Rhythm: Normal rate and regular rhythm.     Heart sounds: No murmur heard. Pulmonary:     Effort: Pulmonary effort is normal. No respiratory distress.     Breath sounds: Normal breath sounds.  Abdominal:     Palpations: Abdomen is soft.     Tenderness: There is no abdominal tenderness.  Musculoskeletal:        General: No swelling.     Cervical back: Neck supple.  Skin:    General: Skin is warm and dry.     Capillary Refill: Capillary refill takes less than 2 seconds.  Neurological:     Mental Status: She is alert.  Psychiatric:        Mood and Affect: Mood normal.     ED Results and Treatments Labs (all labs ordered are listed, but only abnormal results are displayed) Labs Reviewed - No data to display                                                                                                                        Radiology No results found.  Pertinent labs & imaging results that were available during my care of the patient were reviewed by me and considered in my medical decision making (see MDM for details).  Medications Ordered in ED Medications  amoxicillin -clavulanate (AUGMENTIN ) 875-125 MG per tablet 1 tablet (1 tablet Oral Given 09/16/23 0540)  ketorolac  (TORADOL ) 15 MG/ML injection 15 mg (15 mg Intramuscular Given 09/16/23 0540)  metroNIDAZOLE  (FLAGYL ) tablet 500 mg (500 mg Oral Given 09/16/23 0540)  Procedures Procedures  (including critical care time)  Medical Decision Making / ED Course   This patient presents  to the ED for concern of mouth sores, this involves an extensive number of treatment options, and is a complaint that carries with it a high risk of complications and morbidity.  The differential diagnosis includes dental caries, aphthous ulcer, HSV, ANUG  MDM: Patient seen emergency room for evaluation of mouth sores.  Physical exam reveals extensive dental disease with purulent gingiva and ulceration to the adjacent anterior lower lip.  Presentation consistent with acute necrotizing ulcerative gingivitis and we will treat with Augmentin  and Flagyl .  Patient received first dose of antibiotics here in the emergency room and and she assures me she will reach out to her dentist.  At this time she does not meet inpatient criteria for admission will be discharged with outpatient follow-up.   Additional history obtained:  -External records from outside source obtained and reviewed including: Chart review including previous notes, labs, imaging, consultation notes     Medicines ordered and prescription drug management: Meds ordered this encounter  Medications   amoxicillin -clavulanate (AUGMENTIN ) 875-125 MG per tablet 1 tablet   ketorolac  (TORADOL ) 15 MG/ML injection 15 mg   metroNIDAZOLE  (FLAGYL ) tablet 500 mg   DISCONTD: metroNIDAZOLE  (FLAGYL ) 500 MG tablet    Sig: Take 1 tablet (500 mg total) by mouth 2 (two) times daily.    Dispense:  14 tablet    Refill:  0   metroNIDAZOLE  (FLAGYL ) 500 MG tablet    Sig: Take 1 tablet (500 mg total) by mouth 3 (three) times daily for 7 days.    Dispense:  21 tablet    Refill:  0   amoxicillin -clavulanate (AUGMENTIN ) 875-125 MG tablet    Sig: Take 1 tablet by mouth every 12 (twelve) hours.    Dispense:  14 tablet    Refill:  0    -I have reviewed the patients home medicines and have made adjustments as needed  Critical interventions none   Social Determinants of Health:  Factors impacting patients care include: Homeless   Reevaluation: After  the interventions noted above, I reevaluated the patient and found that they have :improved  Co morbidities that complicate the patient evaluation  Past Medical History:  Diagnosis Date   Diabetes mellitus without complication (HCC)    Hypotension       Dispostion: I considered admission for this patient, but at this time she does not meet inpatient criteria for admission will be discharged with outpatient follow-up     Final Clinical Impression(s) / ED Diagnoses Final diagnoses:  ANUG (acute necrotizing ulcerative gingivitis)     @PCDICTATION @    Albertina Dixon, MD 09/16/23 [phone]

## 2023-09-17 DIAGNOSIS — F4312 Post-traumatic stress disorder, chronic: Secondary | ICD-10-CM | POA: Diagnosis not present

## 2023-09-18 DIAGNOSIS — F4312 Post-traumatic stress disorder, chronic: Secondary | ICD-10-CM | POA: Diagnosis not present

## 2023-09-19 DIAGNOSIS — F4312 Post-traumatic stress disorder, chronic: Secondary | ICD-10-CM | POA: Diagnosis not present

## 2023-09-20 DIAGNOSIS — F4312 Post-traumatic stress disorder, chronic: Secondary | ICD-10-CM | POA: Diagnosis not present

## 2023-09-25 DIAGNOSIS — F4312 Post-traumatic stress disorder, chronic: Secondary | ICD-10-CM | POA: Diagnosis not present

## 2023-09-26 DIAGNOSIS — F4312 Post-traumatic stress disorder, chronic: Secondary | ICD-10-CM | POA: Diagnosis not present

## 2023-09-27 DIAGNOSIS — F4312 Post-traumatic stress disorder, chronic: Secondary | ICD-10-CM | POA: Diagnosis not present

## 2023-09-28 DIAGNOSIS — F4312 Post-traumatic stress disorder, chronic: Secondary | ICD-10-CM | POA: Diagnosis not present

## 2023-10-12 ENCOUNTER — Encounter (HOSPITAL_COMMUNITY): Payer: Self-pay

## 2023-10-12 ENCOUNTER — Other Ambulatory Visit: Payer: Self-pay

## 2023-10-12 ENCOUNTER — Emergency Department (HOSPITAL_COMMUNITY)

## 2023-10-12 ENCOUNTER — Emergency Department (HOSPITAL_COMMUNITY)
Admission: EM | Admit: 2023-10-12 | Discharge: 2023-10-12 | Disposition: A | Discharge status: Home / Self Care | Attending: Emergency Medicine | Admitting: Emergency Medicine

## 2023-10-12 DIAGNOSIS — K089 Disorder of teeth and supporting structures, unspecified: Secondary | ICD-10-CM | POA: Diagnosis present

## 2023-10-12 DIAGNOSIS — K0510 Chronic gingivitis, plaque induced: Secondary | ICD-10-CM | POA: Diagnosis not present

## 2023-10-12 DIAGNOSIS — K0500 Acute gingivitis, plaque induced: Secondary | ICD-10-CM | POA: Diagnosis not present

## 2023-10-12 DIAGNOSIS — Z3201 Encounter for pregnancy test, result positive: Secondary | ICD-10-CM | POA: Diagnosis not present

## 2023-10-12 LAB — CBC WITH DIFFERENTIAL/PLATELET
Abs Immature Granulocytes: 0.03 K/uL (ref 0.00–0.07)
Basophils Absolute: 0 K/uL (ref 0.0–0.1)
Basophils Relative: 0 %
Eosinophils Absolute: 0.1 K/uL (ref 0.0–0.5)
Eosinophils Relative: 1 %
HCT: 41.1 % (ref 36.0–46.0)
Hemoglobin: 13.1 g/dL (ref 12.0–15.0)
Immature Granulocytes: 0 %
Lymphocytes Relative: 21 %
Lymphs Abs: 2 K/uL (ref 0.7–4.0)
MCH: 29.8 pg (ref 26.0–34.0)
MCHC: 31.9 g/dL (ref 30.0–36.0)
MCV: 93.6 fL (ref 80.0–100.0)
Monocytes Absolute: 0.6 K/uL (ref 0.1–1.0)
Monocytes Relative: 6 %
Neutro Abs: 6.9 K/uL (ref 1.7–7.7)
Neutrophils Relative %: 72 %
Platelets: 207 K/uL (ref 150–400)
RBC: 4.39 MIL/uL (ref 3.87–5.11)
RDW: 14.2 % (ref 11.5–15.5)
WBC: 9.6 K/uL (ref 4.0–10.5)
nRBC: 0 % (ref 0.0–0.2)

## 2023-10-12 LAB — PREGNANCY, URINE: Preg Test, Ur: POSITIVE — AB

## 2023-10-12 LAB — COMPREHENSIVE METABOLIC PANEL WITH GFR
ALT: 15 U/L (ref 0–44)
AST: 18 U/L (ref 15–41)
Albumin: 3.2 g/dL — ABNORMAL LOW (ref 3.5–5.0)
Alkaline Phosphatase: 58 U/L (ref 38–126)
Anion gap: 8 (ref 5–15)
BUN: 6 mg/dL (ref 6–20)
CO2: 23 mmol/L (ref 22–32)
Calcium: 8 mg/dL — ABNORMAL LOW (ref 8.9–10.3)
Chloride: 107 mmol/L (ref 98–111)
Creatinine, Ser: 0.82 mg/dL (ref 0.44–1.00)
GFR, Estimated: >60 mL/min (ref >60)
Glucose, Bld: 97 mg/dL (ref 70–99)
Potassium: 4.3 mmol/L (ref 3.5–5.1)
Sodium: 138 mmol/L (ref 135–145)
Total Bilirubin: 0.7 mg/dL (ref 0.0–1.2)
Total Protein: 6.6 g/dL (ref 6.5–8.1)

## 2023-10-12 LAB — RAPID HIV SCREEN (HIV 1/2 AB+AG)
HIV 1/2 Antibodies: NONREACTIVE
HIV-1 P24 Antigen - HIV24: NONREACTIVE

## 2023-10-12 MED ORDER — AMOXICILLIN-POT CLAVULANATE 875-125 MG PO TABS: 1.0000 | ORAL_TABLET | Freq: Two times a day (BID) | ORAL | 0 refills | Status: AC

## 2023-10-12 MED ORDER — CHLORHEXIDINE GLUCONATE 0.12 % MT SOLN: 15.0000 mL | Freq: Two times a day (BID) | OROMUCOSAL | 0 refills | Status: DC

## 2023-10-12 MED ORDER — IOHEXOL 350 MG/ML SOLN
75.0000 mL | Freq: Once | INTRAVENOUS | Status: AC | PRN
  Administered 2023-10-12: 75 mL via INTRAVENOUS

## 2023-10-12 MED ORDER — OXYCODONE HCL 5 MG PO TABS: 5.0000 mg | ORAL_TABLET | Freq: Four times a day (QID) | ORAL | 0 refills | Status: AC | PRN

## 2023-10-12 MED ORDER — ONDANSETRON 4 MG PO TBDP
4.0000 mg | ORAL_TABLET | Freq: Once | ORAL | Status: AC
  Administered 2023-10-12: 4 mg via ORAL
  Filled 2023-10-12: qty 1

## 2023-10-12 MED ORDER — CHLORHEXIDINE GLUCONATE 0.12 % MT SOLN
15.0000 mL | Freq: Once | OROMUCOSAL | Status: AC
  Administered 2023-10-12: 15 mL via OROMUCOSAL
  Filled 2023-10-12: qty 15

## 2023-10-12 MED ORDER — SODIUM CHLORIDE 0.9 % IV SOLN
3.0000 g | Freq: Once | INTRAVENOUS | Status: AC
  Administered 2023-10-12: 3 g via INTRAVENOUS
  Filled 2023-10-12: qty 8

## 2023-10-12 MED ORDER — OXYCODONE HCL 5 MG PO TABS
5.0000 mg | ORAL_TABLET | Freq: Once | ORAL | Status: AC
  Administered 2023-10-12: 5 mg via ORAL
  Filled 2023-10-12: qty 1

## 2023-10-12 NOTE — ED Triage Notes (Signed)
 Pt came in via POV d/t infection in her gums around her teeth appear black & has been bothering her the past week. Endorses the IBU & Tylenol  do not work & having issues finding a Education officer, community that accept her medicaid. A/Ox4, rates her pain 10/10 during triage.

## 2023-10-12 NOTE — ED Notes (Signed)
Pt ambulating to bathroom to provide UA

## 2023-10-12 NOTE — ED Provider Notes (Signed)
 [place] EMERGENCY DEPARTMENT AT [facility] Provider Note   CSN: [id number] Arrival date & time: 10/12/23  9096     Patient presents with: Dental Pain   Robin Petersen is a 33 y.o. female with no significant past medical history who presents emergency department for dental and gum pain.  Patient states that she has had recurrent episodes of gum swelling, pain, bleeding and dental pain.  Patient states that secondary to insurance she has been unable to be evaluated by dentist.  She states that she woke up this morning and her gums were acutely worsening with swelling, redness and bleeding.  She has been unable to tolerate p.o. intake secondary to the pain.  Denies associated fever, nausea, emesis, abdominal pain.    Dental Pain      Prior to Admission medications   Medication Sig Start Date End Date Taking? Authorizing Provider  amoxicillin -clavulanate (AUGMENTIN ) 875-125 MG tablet Take 1 tablet by mouth every 12 (twelve) hours for 7 days. 10/12/23 10/19/23 Yes Nada Chroman, DO  chlorhexidine  (PERIDEX ) 0.12 % solution Use as directed 15 mLs in the mouth or throat 2 (two) times daily. 10/12/23  Yes Horton, Kristie M, DO  oxyCODONE  (ROXICODONE ) 5 MG immediate release tablet Take 1 tablet (5 mg total) by mouth every 6 (six) hours as needed for up to 3 days for severe pain (pain score 7-10). 10/12/23 10/15/23 Yes Nada Chroman, DO  albuterol  (VENTOLIN  HFA) 108 (90 Base) MCG/ACT inhaler Inhale 1 puff into the lungs every 6 (six) hours as needed for wheezing or shortness of breath.    [provider]  buPROPion (WELLBUTRIN XL) 150 MG 24 hr tablet Take 150 mg by mouth daily.    [provider]  colchicine  0.6 MG tablet When you get this filled take 2 pills at the same time by mouth then wait an hour and take the third pill by mouth 09/21/22   Emil Share, DO  ferrous sulfate 325 (65 FE) MG EC tablet Take 325 mg by mouth daily with breakfast.    [provider]   fluticasone-salmeterol (ADVAIR) 100-50 MCG/ACT AEPB Inhale 1 puff into the lungs 2 (two) times daily.    [provider]  ibuprofen  (ADVIL ) 600 MG tablet Take 1 tablet (600 mg total) by mouth every 6 (six) hours as needed. 08/08/23   Haze Lonni PARAS, MD  loratadine (CLARITIN) 10 MG tablet Take 10 mg by mouth daily.    [provider]  meclizine  (ANTIVERT ) 25 MG tablet Take 1 tablet (25 mg total) by mouth 3 (three) times daily as needed for dizziness. 07/22/22   Jarold Olam HERO, PA-C  meloxicam (MOBIC) 15 MG tablet Take 15 mg by mouth daily.    [provider]  methocarbamol  (ROBAXIN ) 500 MG tablet Take 1 tablet (500 mg total) by mouth every 6 (six) hours as needed for muscle spasms. 07/07/23   Suellen Cantor A, PA-C  naproxen  (NAPROSYN ) 500 MG tablet Take 1 tablet (500 mg total) by mouth 2 (two) times daily. 07/07/23   Suellen Cantor A, PA-C  ondansetron  (ZOFRAN -ODT) 4 MG disintegrating tablet Take 1 tablet (4 mg total) by mouth every 8 (eight) hours as needed for nausea. 07/22/22   Jarold Olam HERO, PA-C  Vitamin D, Ergocalciferol, (DRISDOL) 1.25 MG (50000 UNIT) CAPS capsule Take 50,000 Units by mouth every 7 (seven) days.    [provider]    Allergies: Patient has no known allergies.    Review of Systems  Updated  Vital Signs BP 117/80   Pulse 97   Temp 98.7 F (37.1 C)   Resp 16   Ht 5' 7 (1.702 m)   Wt 86.2 kg   SpO2 100%   BMI 29.76 kg/m   Physical Exam Vitals reviewed.  HENT:     Head: Normocephalic and atraumatic.     Mouth/Throat:     Dentition: Abnormal dentition. Gingival swelling and dental caries present. No dental tenderness or dental abscesses.     Comments: Diffuse erythema, swelling to upper and lower gingiva with poor dentition however no obvious abscess.  Oropharynx without evidence of edema, exudate or swelling. Neurological:     Mental Status: She is alert.  Psychiatric:        Behavior: Behavior is cooperative.      (all labs ordered are listed, but only abnormal results are displayed) Labs Reviewed  PREGNANCY, URINE - Abnormal; Notable for the following components:      Result Value   Preg Test, Ur POSITIVE (*)    All other components within normal limits  COMPREHENSIVE METABOLIC PANEL WITH GFR - Abnormal; Notable for the following components:   Calcium 8.0 (*)    Albumin 3.2 (*)    All other components within normal limits  CBC WITH DIFFERENTIAL/PLATELET  RAPID HIV SCREEN (HIV 1/2 AB+AG)    EKG: None  Radiology: CT Maxillofacial W Contrast Result Date: 10/12/2023 EXAM: CT Face with contrast 10/12/2023 12:45:02 PM TECHNIQUE: CT of the face was performed with the administration of intravenous contrast. Multiplanar reformatted images are provided for review. Automated exposure control, iterative reconstruction, and/or weight based adjustment of the mA/kV was utilized to reduce the radiation dose to as low as reasonably achievable. COMPARISON: CT of the head dated 08/24/2023. CLINICAL HISTORY: Gingivitis. Pt came in via POV d/t infection in her gums around her teeth appear black \\T \ has been bothering her the past week. FINDINGS: AERODIGESTIVE TRACT: No mass. No edema. SALIVARY GLANDS: No acute abnormality. LYMPH NODES: Shotty level 1 through 4 cervical lymph nodes present bilaterally, which measure up to 8 mm in short axis. SOFT TISSUES: No mass or fluid collection. No significant soft tissue stranding or evidence of abscess is present. BRAIN, ORBITS AND SINUSES: Right phthisis bulbi. BONES: No acute abnormality. No suspicious bone lesion. IMPRESSION: 1. No acute abnormality of the face related to the clinical history of gingivitis. Electronically signed by: evalene coho 10/12/2023 01:06 PM EDT RP Workstation: [id number]     Procedures   Medications Ordered in the ED  ondansetron  (ZOFRAN -ODT) disintegrating tablet 4 mg (4 mg Oral Given 10/12/23 1115)  oxyCODONE  (Oxy IR/ROXICODONE ) immediate  release tablet 5 mg (5 mg Oral Given 10/12/23 1114)  chlorhexidine  (PERIDEX ) 0.12 % solution 15 mL (15 mLs Mouth/Throat Given 10/12/23 1254)  Ampicillin -Sulbactam (UNASYN ) 3 g in sodium chloride  0.9 % 100 mL IVPB (0 g Intravenous Stopped 10/12/23 1205)  iohexol  (OMNIPAQUE ) 350 MG/ML injection 75 mL (75 mLs Intravenous Contrast Given 10/12/23 1236)                                    Medical Decision Making Amount and/or Complexity of Data Reviewed Labs: ordered. Radiology: ordered.  Risk Prescription drug management.   On initial evaluation patient is hemodynamically stable, afebrile with pain secondary to gingival swelling and edema.  Based on patient's history and physical examination findings differential diagnosis include gingivitis, dental abscess, acute necrotizing ulcerative gingivitis, immunocompromise  state.  Based on patient's physical examination findings with diffuse gingival edema, erythema and tenderness to palpation will obtain CT imaging to assess for osteonecrosis, abscess formation or osteomyelitis.  Based upon poor dentition as well as gingivitis will give Unasyn  and pain control.  CT imaging without evidence of acute abnormality including osteonecrosis, abscess formation or acute fracture.  At this time, believe patient is safe to be discharged home with outpatient Augmentin , pain control and referral to dentistry.  Patient was discharged and urine pregnancy test was positive.  Attempted to contact patient with the phone number provided however unsuccessful.     Final diagnoses:  Gingivitis    ED Discharge Orders          Ordered    amoxicillin -clavulanate (AUGMENTIN ) 875-125 MG tablet  Every 12 hours        10/12/23 1317    chlorhexidine  (PERIDEX ) 0.12 % solution  2 times daily        10/12/23 1319    oxyCODONE  (ROXICODONE ) 5 MG immediate release tablet  Every 6 hours PRN        10/12/23 1322            Lavanda Bolster DO Emergency Medicine PGY2   Jenaye Rickert,  Aaren Krog, DO 10/12/23 1607    Horton, Kristie M, DO 10/13/23 [phone]

## 2023-10-12 NOTE — Discharge Instructions (Addendum)
 Thank you for allowing us  to care for you. Please take Augmentin  twice daily for 7 days and swish with the mouthwash twice daily. We have given you 3 clinics to call to see if you are able to schedule an appointment  Please return to the emergency department if you experience worsening pain, inability to tolerate anything by mouth, passout or believe you need emergent medical care

## 2023-11-20 ENCOUNTER — Other Ambulatory Visit: Payer: Self-pay

## 2023-11-20 ENCOUNTER — Emergency Department (HOSPITAL_COMMUNITY)
Admission: EM | Admit: 2023-11-20 | Discharge: 2023-11-21 | Discharge status: Against Medical Advice | Attending: Obstetrics and Gynecology | Admitting: Obstetrics and Gynecology

## 2023-11-20 ENCOUNTER — Encounter (HOSPITAL_COMMUNITY): Payer: Self-pay

## 2023-11-20 DIAGNOSIS — Z3201 Encounter for pregnancy test, result positive: Secondary | ICD-10-CM | POA: Insufficient documentation

## 2023-11-20 DIAGNOSIS — Z5321 Procedure and treatment not carried out due to patient leaving prior to being seen by health care provider: Secondary | ICD-10-CM | POA: Insufficient documentation

## 2023-11-20 DIAGNOSIS — O3680X0 Pregnancy with inconclusive fetal viability, not applicable or unspecified: Secondary | ICD-10-CM | POA: Diagnosis present

## 2023-11-20 DIAGNOSIS — Z3A01 Less than 8 weeks gestation of pregnancy: Secondary | ICD-10-CM | POA: Diagnosis not present

## 2023-11-20 DIAGNOSIS — Z5900 Homelessness unspecified: Secondary | ICD-10-CM | POA: Diagnosis not present

## 2023-11-20 DIAGNOSIS — O208 Other hemorrhage in early pregnancy: Secondary | ICD-10-CM | POA: Diagnosis not present

## 2023-11-20 DIAGNOSIS — O99331 Smoking (tobacco) complicating pregnancy, first trimester: Secondary | ICD-10-CM | POA: Diagnosis not present

## 2023-11-20 LAB — POC URINE PREG, ED: Preg Test, Ur: POSITIVE — AB

## 2023-11-20 NOTE — ED Triage Notes (Signed)
 Patient appears to have positive pregnancy test on August 1st.

## 2023-11-20 NOTE — ED Triage Notes (Signed)
 Patient states that she took a pregnancy test at the [facility] and it was positive, she went to [facility] and they sent her here for positive test before seeing her. Patient states she has the test in her pocket.

## 2023-11-21 ENCOUNTER — Inpatient Hospital Stay (HOSPITAL_COMMUNITY)

## 2023-11-21 ENCOUNTER — Inpatient Hospital Stay (HOSPITAL_COMMUNITY)
Admission: EM | Admit: 2023-11-21 | Discharge: 2023-11-21 | Disposition: A | Discharge status: Home / Self Care | Source: Home / Self Care | Admitting: Obstetrics and Gynecology

## 2023-11-21 ENCOUNTER — Encounter (HOSPITAL_COMMUNITY): Payer: Self-pay

## 2023-11-21 DIAGNOSIS — Z3A01 Less than 8 weeks gestation of pregnancy: Secondary | ICD-10-CM | POA: Insufficient documentation

## 2023-11-21 DIAGNOSIS — Z5900 Homelessness unspecified: Secondary | ICD-10-CM | POA: Insufficient documentation

## 2023-11-21 DIAGNOSIS — O3680X0 Pregnancy with inconclusive fetal viability, not applicable or unspecified: Secondary | ICD-10-CM

## 2023-11-21 DIAGNOSIS — O99331 Smoking (tobacco) complicating pregnancy, first trimester: Secondary | ICD-10-CM | POA: Insufficient documentation

## 2023-11-21 DIAGNOSIS — O208 Other hemorrhage in early pregnancy: Secondary | ICD-10-CM | POA: Insufficient documentation

## 2023-11-21 LAB — CBC
HCT: 37.4 % (ref 36.0–46.0)
Hemoglobin: 12.1 g/dL (ref 12.0–15.0)
MCH: 29.3 pg (ref 26.0–34.0)
MCHC: 32.4 g/dL (ref 30.0–36.0)
MCV: 90.6 fL (ref 80.0–100.0)
Platelets: 254 K/uL (ref 150–400)
RBC: 4.13 MIL/uL (ref 3.87–5.11)
RDW: 13.8 % (ref 11.5–15.5)
WBC: 9.9 K/uL (ref 4.0–10.5)
nRBC: 0 % (ref 0.0–0.2)

## 2023-11-21 LAB — HCG, QUANTITATIVE, PREGNANCY: hCG, Beta Chain, Quant, S: 28085 m[IU]/mL — ABNORMAL HIGH (ref <5)

## 2023-11-21 NOTE — ED Notes (Signed)
 Pt called for lab work x2, no response.

## 2023-11-21 NOTE — ED Triage Notes (Signed)
 Pt arrives via POV. States she was here yesterday but had to leave. States she wants to know if she is pregnant.

## 2023-11-21 NOTE — ED Notes (Signed)
 Pt called again for repeat lab work and vitals, pt not seen in lobby.

## 2023-11-21 NOTE — ED Provider Triage Note (Signed)
 Emergency Medicine Provider Triage Evaluation Note  Robin Petersen , a 33 y.o. female  was evaluated in triage.  Pt complains of pregnancy, wants an ultrasound.  She has no complaints, no abdominal pain, no vaginal bleeding.  Patient initially had positive pregnancy test in the ED on 8/1, tells me that there are no providers who will accept her Medicaid and she has been unable to have an ultrasound and I want to find out if I am having 1 or 2. She had a second positive pregnancy test in the ED yesterday but did not stay for further evaluation.  Review of Systems  Positive: As above Negative: As above  Physical Exam  BP 107/75   Pulse (!) 105   Temp 98.3 F (36.8 C)   Resp 16   LMP 10/08/2023 (Approximate)   SpO2 99%  Gen:   Awake, no distress   Resp:  Normal effort  MSK:   Moves extremities without difficulty  Other:    Medical Decision Making  Medically screening exam initiated at 4:15 PM.  Appropriate orders placed.  Robin Petersen was informed that the remainder of the evaluation will be completed by another provider, this initial triage assessment does not replace that evaluation, and the importance of remaining in the ED until their evaluation is complete.  LMP ~July 21  I spoke with Dr. Magali at [facility] who agrees that this patient is appropriate to send to [facility] for further evaluation.   Robin Petersen, [place] 11/21/23 [phone]
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
## 2023-11-21 NOTE — MAU Note (Signed)
 Robin Petersen is a 33 y.o. at Unknown here in MAU reporting: wanting an US , no c/o VB, LOF, abd pain, or urinary symptoms.- pt believes to be almost 3 months pregnant and I feel both of them moving.   LMP: 10/01/23 Onset of complaint: na Pain score: 0/10 Vitals:   11/21/23 1421  BP: 107/75  Pulse: (!) 105  Resp: 16  Temp: 98.3 F (36.8 C)  SpO2: 99%     FHT: na  Lab orders placed from triage: na

## 2023-11-21 NOTE — ED Notes (Signed)
 Call for lab work 2xs no answer

## 2023-11-22 ENCOUNTER — Inpatient Hospital Stay (HOSPITAL_COMMUNITY)

## 2023-11-22 LAB — ABO/RH: ABO/RH(D): O POS

## 2023-12-01 ENCOUNTER — Encounter (HOSPITAL_COMMUNITY): Payer: Self-pay | Admitting: Obstetrics and Gynecology

## 2023-12-01 ENCOUNTER — Inpatient Hospital Stay (HOSPITAL_COMMUNITY)
Admission: AD | Admit: 2023-12-01 | Discharge: 2023-12-01 | Disposition: A | Discharge status: Home / Self Care | Payer: MEDICAID | Attending: Obstetrics and Gynecology | Admitting: Obstetrics and Gynecology

## 2023-12-01 ENCOUNTER — Inpatient Hospital Stay (HOSPITAL_COMMUNITY): Payer: MEDICAID

## 2023-12-01 DIAGNOSIS — N939 Abnormal uterine and vaginal bleeding, unspecified: Secondary | ICD-10-CM

## 2023-12-01 DIAGNOSIS — O4691 Antepartum hemorrhage, unspecified, first trimester: Secondary | ICD-10-CM | POA: Diagnosis present

## 2023-12-01 DIAGNOSIS — O039 Complete or unspecified spontaneous abortion without complication: Secondary | ICD-10-CM | POA: Diagnosis not present

## 2023-12-01 LAB — CBC
HCT: 34.5 % — ABNORMAL LOW (ref 36.0–46.0)
Hemoglobin: 11.2 g/dL — ABNORMAL LOW (ref 12.0–15.0)
MCH: 29.9 pg (ref 26.0–34.0)
MCHC: 32.5 g/dL (ref 30.0–36.0)
MCV: 92 fL (ref 80.0–100.0)
Platelets: 198 K/uL (ref 150–400)
RBC: 3.75 MIL/uL — ABNORMAL LOW (ref 3.87–5.11)
RDW: 13.8 % (ref 11.5–15.5)
WBC: 9.1 K/uL (ref 4.0–10.5)
nRBC: 0 % (ref 0.0–0.2)

## 2023-12-01 LAB — HCG, QUANTITATIVE, PREGNANCY: hCG, Beta Chain, Quant, S: 2710 m[IU]/mL — ABNORMAL HIGH (ref <5)

## 2023-12-01 LAB — TYPE AND SCREEN
ABO/RH(D): O POS
Antibody Screen: NEGATIVE

## 2023-12-01 MED ORDER — LACTATED RINGERS IV BOLUS
1000.0000 mL | Freq: Once | INTRAVENOUS | Status: AC
  Administered 2023-12-01: 1000 mL via INTRAVENOUS

## 2023-12-01 MED ORDER — IBUPROFEN 800 MG PO TABS: 800.0000 mg | ORAL_TABLET | Freq: Three times a day (TID) | ORAL | 3 refills | Status: AC | PRN

## 2023-12-01 MED ORDER — TRANEXAMIC ACID-NACL 1000-0.7 MG/100ML-% IV SOLN
1000.0000 mg | INTRAVENOUS | Status: AC
  Administered 2023-12-01: 1000 mg via INTRAVENOUS
  Filled 2023-12-01: qty 100

## 2023-12-01 MED ORDER — MISOPROSTOL 200 MCG PO TABS
800.0000 ug | ORAL_TABLET | Freq: Once | ORAL | Status: AC
  Administered 2023-12-01: 800 ug via ORAL
  Filled 2023-12-01: qty 4

## 2023-12-01 MED ORDER — ONDANSETRON 8 MG PO TBDP: 8.0000 mg | ORAL_TABLET | Freq: Three times a day (TID) | ORAL | 0 refills | Status: AC | PRN

## 2023-12-01 NOTE — Discharge Instructions (Signed)
 I am so sorry that this is happening. You may experience many different emotions while you grieve, and it is normal to have emotional stress after a pregnancy loss. Emotional recovery can take longer than physical recovery. There is nothing that you did or did not do to cause this to happen; it is not your fault. Please reach out if you need support, we are here for you. You can also join a group to help work through your grief: [email or web address]  EARLY PREGNANCY LOSS Once the egg is fertilized with the sperm and begins to develop, it attaches to the lining of the uterus. This early pregnancy tissue may not develop into an embryo (the beginning stage of a baby). Sometimes an embryo does develop but does not continue to grow. These problems can be seen on ultrasound.  About 1 in 4 women will have a pregnancy loss in her lifetime.  One in five pregnancies is found to be an early pregnancy failure.    MANAGING EARLY PREGNANCY LOSS Since you have not passed all of the tissue, our recommendation is to use a medicine called Cytotec  to help your body pass the rest of it and prevent infection. It is also very important that we check your blood hormone level weekly until it reaches 0 to ensure that there is no remaining tissue in the uterus that can cause infection or heavy vaginal bleeding.  MEDICATION (CYTOTEC ): this is a medication that causes cramping of the uterus and can help passage of the pregnancy.  Prostaglandins (Cytotec ) cause the uterus to cramp and contract. You will place the medicine yourself inside your vagina in the privacy of your home. Empting of the uterus should occur within 3 days but the process may continue for several weeks.  This medication increases the likelihood you will pass the pregnancy. The failure rate is about  15-20% Shortens the time to passing your pregnancy, usually within 24 hours and you can repeat the dose if needed.  The complete procedure may take days to weeks No Surgery Bleeding may be heavy at times There may be drug side effects Nausea or vomiting Diarrhea Chills Fever, Hot Flashes Patient has more control Low risk: less than 1 in 100 women has a complication If the pregnancy does not pass completely, you may need surgery  WHAT TO EXPECT AS THE PREGNANCY PASSES: Cramping   Bleeding Headaches   Dizziness To help with these symptoms, prescriptions for three medications. You can take ibuprofen  for pain. You may take the Zofran  for any nausea. You can also take Tylenol  for pain if you prefer.  AFTER THE PREGNANCY PASSES: Everybody will feel differently after the early pregnancy passes.  You may have soreness or cramps for a day or two.  You may have soreness or cramps for day or two.   You may have light bleeding for up to 2 weeks.  You may be as active as you feel like being. You should have a follow-up exam with your OBGYN in about 2 weeks to ensure you are healing properly. Your next period is expected to start again about 4-6 weeks afterward. It is possible to get pregnant soon after a loss.  WHAT TO EXPECT AFTER CYTOTEC : - Mild nausea or GI upset. I have sent in a medication called Zofran  in case you have this side effect - Cramping and contraction like pain. This is related to the passage of the pregnancy. You can take the ibuprofen  or oxycodone  that I sent to the  pharmacy. Ibuprofen  is especially helpful for cramping pain. - Usually this medication will help you pass the pregnancy within 24 hours. - Bleeding which is expected and usually not severe. We expect and want you to pass clots and blood for the first 1-3 days. - You may continue to have light bleeding for 2-4 weeks. - We usually repeat your ultrasound in 2-3 seeks to make sure everything has passed.  Reasons  to return to MAU at [facility]: If you have heavier bleeding that soaks through more that 2 full-size pads per hour for an hour or more If you bleed so much that you feel like you might pass out or you do pass out If you feel lightheaded or dizzy If you have significant abdominal pain that is not improved with Tylenol  1000 mg every 8 hours as needed for pain If you develop a fever > 100.5  If you have foul-smelling vaginal discharge

## 2023-12-01 NOTE — MAU Note (Signed)
 Robin Petersen is a 33 y.o. at [redacted]w[redacted]d here in MAU reporting: EMS arrival :c/o lower abd pain and cramping this morning and some vag bleeding. Whens she got to MAU her pain became very intense and she went to the BR. Stated she thinks she miscarried in the BR stated she saw  mucus sac come out.  Reports she is not having any pain now.  LMP:  Onset of complaint: this morning Pain score: 0 Vitals:   12/01/23 1415  BP: 118/74  Pulse: 73  Resp: 18  Temp: 98.7 F (37.1 C)     FHT: n/a  Lab orders placed from triage:

## 2023-12-01 NOTE — MAU Provider Note (Signed)
 Chief Complaint:  Vaginal Bleeding and Abdominal Pain   HPI   Event Date/Time   First Provider Initiated Contact with Patient 12/01/23 1430      Robin Petersen is a 33 y.o. G3P1011 at [redacted]w[redacted]d who presents to maternity admissions reporting vaginal bleeding.   She reports that today, she has lower abdominal cramping and heavy vaginal bleeding that started this morning. She called EMS and was transported to the MAU. Her pain became intense while in the MAU, and when she went to the bathroom saw a mucus sac come out that she thinks could be a miscarriage. After passage of those tissues, pain reduced to 0/10. Denies fever, chills, nausea/vomiting.  Additional history obtained from partner  Pregnancy Course: Seen in MAU on 11/21/2023 with hCG 28,085 and US  without IUGS. Findings suspicious for failed pregnancy. Recommendation of repeat hCG and US , patient did not return for repeat evaluation.  Past Medical History:  Diagnosis Date   Diabetes mellitus without complication (HCC)    Hypotension    OB History  Gravida Para Term Preterm AB Living  3 1 1  1 1   SAB IAB Ectopic Multiple Live Births      1    # Outcome Date GA Lbr Len/2nd Weight Sex Type Anes PTL Lv  3 Current           2 Term 09/24/12 [redacted]w[redacted]d   F CS-Unspec   LIV  1 AB 2013 [redacted]w[redacted]d          Past Surgical History:  Procedure Laterality Date   CESAREAN SECTION     No family history on file. Social History   Tobacco Use   Smoking status: Former    Current packs/day: 0.25    Average packs/day: 0.3 packs/day for 9.0 years (2.3 ttl pk-yrs)    Types: Cigarettes   Smokeless tobacco: Never  Vaping Use   Vaping status: Never Used  Substance Use Topics   Alcohol use: Never   Drug use: Not Currently    Types: Marijuana   No Known Allergies No medications prior to admission.    I have reviewed patient's Past Medical Hx, Surgical Hx, Family Hx, Social Hx, medications and allergies.   ROS  Pertinent items noted in HPI and  remainder of comprehensive ROS otherwise negative.   PHYSICAL EXAM  Patient Vitals for the past 24 hrs:  BP Temp Pulse Resp  12/01/23 1415 118/74 98.7 F (37.1 C) 73 18    Constitutional: Well-developed, well-nourished female in no acute distress.  HEENT: atraumatic, normocephalic. Neck has normal ROM. EOM intact. Cardiovascular: normal rate & rhythm, warm and well-perfused Respiratory: normal effort, no problems with respiration noted GI: Abd soft, non-tender, non-distended MSK: Extremities nontender, no edema, normal ROM Skin: warm and dry. Acyanotic, no jaundice or pallor. Neurologic: Alert and oriented x 4. No abnormal coordination. Psychiatric: Normal mood. Speech not slurred, not rapid/pressured. Patient is cooperative. GU: no CVA tenderness Pelvic exam: bleeding heavily with large clots on initial evaluation.  Labs: Results for orders placed or performed during the hospital encounter of 12/01/23 (from the past 24 hours)  Type and screen     Status: None   Collection Time: 12/01/23  2:53 PM  Result Value Ref Range   ABO/RH(D) O POS    Antibody Screen NEG    Sample Expiration      12/04/2023,2359 Performed at [facility], [address] [place], [place] [address]   CBC     Status: Abnormal  Collection Time: 12/01/23  2:57 PM  Result Value Ref Range   WBC 9.1 4.0 - 10.5 K/uL   RBC 3.75 (L) 3.87 - 5.11 MIL/uL   Hemoglobin 11.2 (L) 12.0 - 15.0 g/dL   HCT 65.4 (L) 63.9 - 53.9 %   MCV 92.0 80.0 - 100.0 fL   MCH 29.9 26.0 - 34.0 pg   MCHC 32.5 30.0 - 36.0 g/dL   RDW 86.1 88.4 - 84.4 %   Platelets 198 150 - 400 K/uL   nRBC 0.0 0.0 - 0.2 %  hCG, quantitative, pregnancy     Status: Abnormal   Collection Time: 12/01/23  2:57 PM  Result Value Ref Range   hCG, Beta Chain, Quant, S 2,710 (H) <5 mIU/mL    Imaging:  US  OB Transvaginal Result Date: 12/01/2023 CLINICAL DATA:  Heavy vaginal bleeding. Patient noted to be pregnant with estimated gestational age per LMP  8 weeks 5 days. Quantitative beta hCG today 2,710 as previous quantitative beta HCG 11/21/2023 28,085. EXAM: TRANSVAGINAL OB ULTRASOUND TECHNIQUE: Transvaginal ultrasound was performed for complete evaluation of the gestation as well as the maternal uterus, adnexal regions, and pelvic cul-de-sac. COMPARISON:  11/21/2023 FINDINGS: Intrauterine gestational sac: Previous visualized gestational sac no longer seen. Yolk sac:  Not visualized. Embryo:  Not visualized. Cardiac Activity: Not visualized. Heart Rate: Not visualized. MSD: Not visualized. CRL: Not visualized. Subchorionic hemorrhage:  None visualized. Maternal uterus/adnexae: Thickened heterogeneous endometrium. Given patient's clinical data, the above findings are compatible with failed pregnancy. Ovaries normal size, shape position with normal color Doppler. Free fluid. IMPRESSION: Given patient's clinical data and previous ultrasound, the above findings are compatible with failed pregnancy. Electronically Signed   By: Toribio Agreste M.D.   On: 12/01/2023 16:47    MDM & MAU COURSE  MDM: High  MAU Course: -Vital signs within normal limits. Afebrile and hemodynamically stable. -CBC, type & screen, and hCG for SAB and to evaluate need for surgical intervention. -Repeat US  to rule out retained POC. -Discussed with Dr. Izell, agrees with plan and recommends IV TXA for acute vaginal bleeding while awaiting results. -Hgb 11.2 with Hct 34.5. -As of 1528: 328 mL blood loss since arrival. -Reviewed lab and imaging findings with Dr. Izell. Recommendation for Cytotec  to ensure complete passage of POC and follow hCG to 0. -hCG has decreased to 2,710. -Cytotec  while in MAU. Sending prescriptions for ibuprofen  and Zofran .  Differential diagnosis considered for 1st trimester vaginal bleeding includes but is not limited to: ectopic pregnancy, complete spontaneous abortion, incomplete abortion, missed abortion, threatened abortion, embryonic/fetal demise,  cervical insufficiency, cervical or vaginal disorder   Orders Placed This Encounter  Procedures   US  OB Transvaginal   CBC   hCG, quantitative, pregnancy   Diet NPO time specified   Type and screen   Discharge patient   Meds ordered this encounter  Medications   tranexamic acid  (CYKLOKAPRON ) IVPB 1,000 mg   lactated ringers  bolus 1,000 mL   misoprostol  (CYTOTEC ) tablet 800 mcg   ibuprofen  (ADVIL ) 800 MG tablet    Sig: Take 1 tablet (800 mg total) by mouth 3 (three) times daily with meals as needed for headache, moderate pain (pain score 4-6) or cramping.    Dispense:  30 tablet    Refill:  3   ondansetron  (ZOFRAN -ODT) 8 MG disintegrating tablet    Sig: Take 1 tablet (8 mg total) by mouth every 8 (eight) hours as needed for nausea or vomiting.    Dispense:  5 tablet  Refill:  0    ASSESSMENT   1. SAB (spontaneous abortion)   2. Vaginal bleeding     PLAN  Discharge home in stable condition with return precautions.  Lab appointment on 9/26 for repeat hCG. Test weekly until 0. Ibuprofen  and Zofran  for any additional cramping or nausea after taking Cytotec .    Follow-up Information     [facility] Follow up in 5 day(s).   Specialty: Obstetrics and Gynecology Why: for repeat blood work Contact information: [address] [place]  [address] [phone]                 Allergies as of 12/01/2023   No Known Allergies      Medication List     TAKE these medications    ibuprofen  800 MG tablet Commonly known as: ADVIL  Take 1 tablet (800 mg total) by mouth 3 (three) times daily with meals as needed for headache, moderate pain (pain score 4-6) or cramping.   ondansetron  8 MG disintegrating tablet Commonly known as: ZOFRAN -ODT Take 1 tablet (8 mg total) by mouth every 8 (eight) hours as needed for nausea or vomiting.        Joesph DELENA Sear, PA

## 2023-12-04 ENCOUNTER — Emergency Department (HOSPITAL_COMMUNITY)
Admission: EM | Admit: 2023-12-04 | Discharge: 2023-12-04 | Discharge status: Against Medical Advice | Payer: MEDICAID | Attending: Emergency Medicine | Admitting: Emergency Medicine

## 2023-12-04 ENCOUNTER — Encounter (HOSPITAL_COMMUNITY): Payer: Self-pay

## 2023-12-04 ENCOUNTER — Other Ambulatory Visit: Payer: Self-pay

## 2023-12-04 DIAGNOSIS — R42 Dizziness and giddiness: Secondary | ICD-10-CM | POA: Diagnosis present

## 2023-12-04 DIAGNOSIS — Z5321 Procedure and treatment not carried out due to patient leaving prior to being seen by health care provider: Secondary | ICD-10-CM | POA: Insufficient documentation

## 2023-12-04 DIAGNOSIS — R519 Headache, unspecified: Secondary | ICD-10-CM | POA: Insufficient documentation

## 2023-12-04 LAB — CBC WITH DIFFERENTIAL/PLATELET
Abs Immature Granulocytes: 0.01 K/uL (ref 0.00–0.07)
Basophils Absolute: 0.1 K/uL (ref 0.0–0.1)
Basophils Relative: 1 %
Eosinophils Absolute: 0.1 K/uL (ref 0.0–0.5)
Eosinophils Relative: 1 %
HCT: 33.7 % — ABNORMAL LOW (ref 36.0–46.0)
Hemoglobin: 10.5 g/dL — ABNORMAL LOW (ref 12.0–15.0)
Immature Granulocytes: 0 %
Lymphocytes Relative: 32 %
Lymphs Abs: 2.6 K/uL (ref 0.7–4.0)
MCH: 29.7 pg (ref 26.0–34.0)
MCHC: 31.2 g/dL (ref 30.0–36.0)
MCV: 95.5 fL (ref 80.0–100.0)
Monocytes Absolute: 0.5 K/uL (ref 0.1–1.0)
Monocytes Relative: 6 %
Neutro Abs: 4.8 K/uL (ref 1.7–7.7)
Neutrophils Relative %: 60 %
Platelets: 219 K/uL (ref 150–400)
RBC: 3.53 MIL/uL — ABNORMAL LOW (ref 3.87–5.11)
RDW: 13.6 % (ref 11.5–15.5)
WBC: 8 K/uL (ref 4.0–10.5)
nRBC: 0 % (ref 0.0–0.2)

## 2023-12-04 LAB — SURGICAL PATHOLOGY

## 2023-12-04 NOTE — ED Triage Notes (Signed)
 Arrives [facility] from friends house. Pt complains of dizziness/lightheadedness after walking to the store.    Recent miscarriage and headache. Says she was educated that she would become lightheaded and dizzy but did not expect it this fast.

## 2023-12-04 NOTE — ED Notes (Signed)
 Pt states she needs to leave to go find her boyfriend, informed to return for worsening symptoms. Left lobby at this time.

## 2023-12-04 NOTE — ED Provider Triage Note (Signed)
 Emergency Medicine Provider Triage Evaluation Note  Robin Petersen , a 33 y.o. female  was evaluated in triage.  Pt complains of dizziness/lightheadedness. Recent miscarriage. HA  Review of Systems  Positive: HA, dizzy, lightheaded, N/D, vaginal bleeding,  Negative: Emesis, fever, numbness, weakness, vaginal odor  Physical Exam  LMP 10/01/2023  Gen:   Awake, no distress   Resp:  Normal effort  MSK:   Moves extremities without difficulty  Other:    Medical Decision Making  Medically screening exam initiated at 10:59 PM.  Appropriate orders placed.  Robin Petersen was informed that the remainder of the evaluation will be completed by another provider, this initial triage assessment does not replace that evaluation, and the importance of remaining in the ED until their evaluation is complete.  Labs ordered   Francis Ileana SAILOR, PA-C 12/04/23 2302

## 2023-12-04 NOTE — ED Notes (Signed)
Pt called x2, no response.  ?

## 2023-12-05 ENCOUNTER — Telehealth: Payer: Self-pay

## 2023-12-05 DIAGNOSIS — O039 Complete or unspecified spontaneous abortion without complication: Secondary | ICD-10-CM

## 2023-12-05 LAB — COMPREHENSIVE METABOLIC PANEL WITH GFR
ALT: 11 U/L (ref 0–44)
AST: 13 U/L — ABNORMAL LOW (ref 15–41)
Albumin: 3.2 g/dL — ABNORMAL LOW (ref 3.5–5.0)
Alkaline Phosphatase: 53 U/L (ref 38–126)
Anion gap: 10 (ref 5–15)
BUN: 11 mg/dL (ref 6–20)
CO2: 23 mmol/L (ref 22–32)
Calcium: 8 mg/dL — ABNORMAL LOW (ref 8.9–10.3)
Chloride: 107 mmol/L (ref 98–111)
Creatinine, Ser: 1.01 mg/dL — ABNORMAL HIGH (ref 0.44–1.00)
GFR, Estimated: >60 mL/min (ref >60)
Glucose, Bld: 95 mg/dL (ref 70–99)
Potassium: 4.1 mmol/L (ref 3.5–5.1)
Sodium: 140 mmol/L (ref 135–145)
Total Bilirubin: 0.7 mg/dL (ref 0.0–1.2)
Total Protein: 6.5 g/dL (ref 6.5–8.1)

## 2023-12-05 NOTE — Progress Notes (Signed)
 Complex Care Management Note Care Guide Note  12/05/2023 Name: Robin Petersen MRN: [id number] DOB: Oct 08, 1990   Complex Care Management Outreach Attempts: An unsuccessful telephone outreach was attempted today to offer the patient information about available complex care management services.  Follow Up Plan:  Additional outreach attempts will be made to offer the patient complex care management information and services.   Encounter Outcome:  No Answer-No Voicemail-Unable to leave a message   Leotis Rase [facility], [facility] Guide  Direct Dial: [phone]  Fax [phone]

## 2023-12-06 ENCOUNTER — Ambulatory Visit: Payer: Self-pay | Admitting: Obstetrics and Gynecology

## 2023-12-06 ENCOUNTER — Telehealth: Payer: Self-pay | Admitting: Family Medicine

## 2023-12-06 ENCOUNTER — Ambulatory Visit: Payer: Self-pay

## 2023-12-06 NOTE — Telephone Encounter (Signed)
 Attempted to call patient to schedule follow up appointment with one of our providers. Call was unsuccessful and was also unable to leave a voicemail. I scheduled appointment and will mail appointment reminder to pt's home address.

## 2023-12-07 ENCOUNTER — Ambulatory Visit: Payer: Self-pay

## 2023-12-10 NOTE — Progress Notes (Unsigned)
 Complex Care Management Note Care Guide Note  12/10/2023 Name: Robin Petersen MRN: [id number] DOB: 1991/02/15   Complex Care Management Outreach Attempts: A second unsuccessful outreach was attempted today to offer the patient with information about available complex care management services.  Follow Up Plan:  Additional outreach attempts will be made to offer the patient complex care management information and services.   Encounter Outcome:  No Answer-No voicemail  Leotis Rase [facility] [place], [facility] Guide  Direct Dial: [phone]  Fax [phone]

## 2023-12-14 NOTE — Progress Notes (Signed)
 Complex Care Management Note Care Guide Note  12/14/2023 Name: Robin Petersen MRN: [id number] DOB: [birth year]   Complex Care Management Outreach Attempts: A third unsuccessful outreach was attempted today to offer the patient with information about available complex care management services.  Follow Up Plan:  No further outreach attempts will be made at this time. We have been unable to contact the patient to offer or enroll patient in complex care management services.  Encounter Outcome:  No Answer-No Voicemail-Unable to leave a message   Leotis Rase [facility], [facility] Guide  Direct Dial: [phone]  Fax [phone]

## 2023-12-17 ENCOUNTER — Encounter: Admitting: Student

## 2024-03-19 ENCOUNTER — Ambulatory Visit
Admission: EM | Admit: 2024-03-19 | Discharge: 2024-03-19 | Disposition: A | Discharge status: Home / Self Care | Payer: MEDICAID | Attending: Urgent Care | Admitting: Urgent Care

## 2024-03-19 ENCOUNTER — Ambulatory Visit (INDEPENDENT_AMBULATORY_CARE_PROVIDER_SITE_OTHER): Payer: MEDICAID

## 2024-03-19 DIAGNOSIS — L03011 Cellulitis of right finger: Secondary | ICD-10-CM | POA: Diagnosis not present

## 2024-03-19 MED ORDER — NAPROXEN 500 MG PO TABS: 500.0000 mg | ORAL_TABLET | Freq: Two times a day (BID) | ORAL | 0 refills | Status: AC

## 2024-03-19 MED ORDER — DOXYCYCLINE HYCLATE 100 MG PO CAPS: 100.0000 mg | ORAL_CAPSULE | Freq: Two times a day (BID) | ORAL | 0 refills | Status: AC

## 2024-03-19 NOTE — Discharge Instructions (Signed)
 Please change your dressing 3-5 times daily. Each time you change your dressing, make sure that you are pressing on the wound to get pus to come out.  Try your best to clean the wound with antibacterial soap and warm water. Pat your wound dry and let it air out if possible to make sure it is dry before reapplying another dressing.   Start doxycycline  for the infection. Use naproxen  for the pain.
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
## 2024-04-02 NOTE — Congregational Nurse Program (Signed)
" °  Dept: [phone]   Congregational Nurse Program Note  Date of Encounter: 04/02/2024  Past Medical History: Past Medical History:  Diagnosis Date   Diabetes mellitus without complication (HCC)    Hypotension     Encounter Details:  Community Questionnaire - 04/02/24 1415       Questionnaire   Ask client: Do you give verbal consent for me to treat you today? Yes    Student Assistance [employer]    Location Patient Served  [facility]    Encounter Setting CN site    Population Status Unhoused    Insurance Medicaid    Insurance/Financial Assistance Referral N/A    Medical Provider Yes    Medical Referrals Made Non-[facility] PCP/Clinic    Medical Appointment Completed N/A    Screenings CN Performed (remember to also record results) NA    CNP Interventions Case Management;Health Counseling;Other Education    ED Visit Averted Yes         [employer] requesting I see client to establish OB care for her given recent positive pregnancy test. RN made appointment for client at [facility] on [address] for 04/08/24 at 1:40pm. They will need to confirm pregnancy test. Client denies any other needs at this time.   "

## 2024-04-08 ENCOUNTER — Ambulatory Visit: Payer: Self-pay
